# Patient Record
Sex: Male | Born: 1955 | Race: Black or African American | Hispanic: No | Marital: Single | Smoking: Former smoker
Health system: Southern US, Community
[De-identification: ages and names within clinical notes are randomized; demographics above are authoritative.]

## PROBLEM LIST (undated history)

## (undated) DIAGNOSIS — C61 Malignant neoplasm of prostate: Secondary | ICD-10-CM

## (undated) DIAGNOSIS — G4730 Sleep apnea, unspecified: Secondary | ICD-10-CM

## (undated) DIAGNOSIS — K219 Gastro-esophageal reflux disease without esophagitis: Secondary | ICD-10-CM

## (undated) DIAGNOSIS — E669 Obesity, unspecified: Secondary | ICD-10-CM

## (undated) DIAGNOSIS — I10 Essential (primary) hypertension: Secondary | ICD-10-CM

## (undated) DIAGNOSIS — E785 Hyperlipidemia, unspecified: Secondary | ICD-10-CM

## (undated) HISTORY — DX: Obesity, unspecified: E66.9

## (undated) HISTORY — PX: PROSTATE BIOPSY: SHX241

## (undated) HISTORY — DX: Essential (primary) hypertension: I10

---

## 2010-12-14 ENCOUNTER — Ambulatory Visit (HOSPITAL_COMMUNITY)
Admission: RE | Admit: 2010-12-14 | Discharge: 2010-12-14 | Disposition: A | Discharge status: Home / Self Care | Payer: Self-pay | Source: Ambulatory Visit | Attending: Family Medicine | Admitting: Family Medicine

## 2010-12-14 DIAGNOSIS — I10 Essential (primary) hypertension: Secondary | ICD-10-CM | POA: Insufficient documentation

## 2010-12-14 DIAGNOSIS — I517 Cardiomegaly: Secondary | ICD-10-CM

## 2010-12-14 DIAGNOSIS — R011 Cardiac murmur, unspecified: Secondary | ICD-10-CM | POA: Insufficient documentation

## 2010-12-21 ENCOUNTER — Encounter: Payer: Self-pay | Admitting: Urgent Care

## 2010-12-21 ENCOUNTER — Ambulatory Visit (INDEPENDENT_AMBULATORY_CARE_PROVIDER_SITE_OTHER): Payer: Self-pay | Admitting: Urgent Care

## 2010-12-21 DIAGNOSIS — K219 Gastro-esophageal reflux disease without esophagitis: Secondary | ICD-10-CM | POA: Insufficient documentation

## 2010-12-21 DIAGNOSIS — K921 Melena: Secondary | ICD-10-CM

## 2010-12-21 NOTE — Assessment & Plan Note (Signed)
Praneel Haisley. is a 55 y.o. black male w/ 2 day hx of hematochezia after starting a new BP pill.  Differentials include ischemia, colorectal ca or polyp, diverticular bleeding, or benign anorectal source.  Least likely rapid transit upper GI source given some recent GERD symptoms.  I have discussed risks & benefits which include, but are not limited to, bleeding, infection, perforation & drug reaction.  The patient agrees with this plan & written consent will be obtained.  Procedure will need to be done with deep sedation (propofol) in the OR under the direction of anesthesia services for hx polysubstance abuse.

## 2010-12-21 NOTE — Progress Notes (Signed)
Referring Provider: Tylene Fantasia., PA Primary Care Physician:  Tylene Fantasia., PA, PA-C Primary Gastroenterologist:  Dr. Darrick Penna  Chief Complaint  Patient presents with  . Diarrhea    better now  . Rectal Bleeding    HPI:  Greg Gates. is a 55 y.o. male here as a referral from Dr. Ledell Peoples for hematochezia & diarrhea.  He notes large amts bright red blood in his stool, the toilet water & on toilet paper after 2 days of diarrhea last week.  This started the same day he started a new BP pill (Atenolol/Chlorthalidone) last week.  No clots.  Diarrhea resolved after stopping pill.  Denies abdominal pain, nausea or vomiting.  Denies dysphagia or odynophagia.  Gaining wt since laid off.  Notes heartburn & Indigestion couple times per month.  Takes TUMS rarely & seems to resolve 100%.  Denies any OTC meds. Past Medical History  Diagnosis Date  . HTN (hypertension)   . Heart murmur    No past surgical history on file.  Current Outpatient Prescriptions  Medication Sig Dispense Refill  . pravastatin (PRAVACHOL) 20 MG tablet Take 20 mg by mouth daily.        Marland Kitchen triamterene-hydrochlorothiazide (MAXZIDE-25) 37.5-25 MG per tablet Take 1 tablet by mouth daily.         Allergies as of 12/21/2010  . (No Known Allergies)   Family History: There is no known family history of colorectal carcinoma , liver disease, or inflammatory bowel disease.  Problem Relation Age of Onset  . Diabetes Father     mother, sister  . Peripheral vascular disease Father   . Prostate cancer Father     uncle & cousin    History   Social History  . Marital Status: Single    Spouse Name: N/A    Number of Children: 1  . Years of Education: N/A   Occupational History  . unemployed     previously worked on [employer]   Social History Main Topics  . Smoking status: Former Smoker -- 1.0 packs/day for 25 years    Types: Cigarettes    Quit date: 12/20/2000  . Smokeless tobacco: Not on file  . Alcohol Use: Yes   daily etoh until 2002, now rarely  . Drug Use: Yes     quit 2002- acid, marijuana, cocaine, crack, hash   . Sexually Active: Not on file   Other Topics Concern  . Not on file   Social History Narrative   1 Grown biological son & step-daughterLives alone  Review of Systems: Gen: Denies any fever, chills, sweats, anorexia, fatigue, weakness, malaise, weight loss, and sleep disorder CV: Denies chest pain, angina, palpitations, syncope, orthopnea, PND, peripheral edema, and claudication. Resp: Denies dyspnea at rest, dyspnea with exercise, cough, sputum, wheezing, coughing up blood, and pleurisy. GI: Denies vomiting blood, jaundice, and fecal incontinence.   Denies dysphagia or odynophagia. GU : Denies urinary burning, blood in urine, urinary frequency, urinary hesitancy, nocturnal urination, and urinary incontinence. MS: Denies joint pain, limitation of movement, and swelling, stiffness, low back pain, extremity pain. Denies muscle weakness, cramps, atrophy.  Derm: Denies rash, itching, dry skin, hives, moles, warts, or unhealing ulcers.  Psych: Denies depression, anxiety, memory loss, suicidal ideation, hallucinations, paranoia, and confusion. Heme: Denies bruising, bleeding, and enlarged lymph nodes.  Physical Exam: BP 142/92  Pulse 89  Temp(Src) 98.2 F (36.8 C) (Temporal)  Ht 6\' 3"  (1.905 m)  Wt 334 lb (151.501 kg)  BMI 41.75 kg/m2 General:  Alert,  Well-developed, obese, pleasant and cooperative in NAD Head:  Normocephalic and atraumatic. Eyes:  Sclera clear, no icterus.   Conjunctiva pink. Ears:  Normal auditory acuity. Nose:  No deformity, discharge,  or lesions. Mouth:  No deformity or lesions, dentition normal. Neck:  Supple; no masses or thyromegaly. Lungs:  Clear throughout to auscultation.   No wheezes, crackles, or rhonchi. No acute distress. Heart:  Regular rate and rhythm; no murmurs, clicks, rubs,  or gallops. Abdomen:  Soft, obese, nontender and nondistended. No  masses, hepatosplenomegaly or hernias noted. Normal bowel sounds, without guarding, and without rebound.   Rectal:  Deferred until time of colonoscopy.   Msk:  Symmetrical without gross deformities. Normal posture. Pulses:  Normal pulses noted. Extremities:  Without clubbing or edema. Neurologic:  Alert and  oriented x4;  grossly normal neurologically. Skin:  Intact without significant lesions or rashes. Cervical Nodes:  No significant cervical adenopathy. Psych:  Alert and cooperative. Normal mood and affect.

## 2010-12-21 NOTE — Progress Notes (Signed)
Cc to PCP 

## 2010-12-21 NOTE — Assessment & Plan Note (Addendum)
Recent heartburn & indigestion sporadically that responds to TUMS.  Given hematochezia, would consider EGD if no lower GI source of bleeding is identified.  Placed on PPI for GERD symptoms and gastric protection.  Prilosec 20mg  daily (2 wks samples given)  I have discussed risks & benefits for EGD which include, but are not limited to, bleeding, infection, perforation & drug reaction.  The patient agrees with this plan & written consent will be obtained.    TO ER if severe bleeding or pain.

## 2010-12-21 NOTE — Patient Instructions (Addendum)
To ER if severe bleeding or pain Begin Prilosec 20 mg daily to protect stomach & for acid reflux (2 wks samples given)  Rectal Bleeding You have been bleeding from the rectal area. This condition can be caused by many different problems, including:  Hemorrhoids ("piles") and anal fissures (cracks in the anal canal).   Diverticulosis, polyps, and peptic ulcer disease.   Malignant tumors (cancer), injuries, and bowel inflammation.  Treatment depends on finding the cause of the bleeding. A sigmoid or colonoscopy exam and barium enema x-rays are often done to make the diagnosis. Avoid strenuous activity and drink plenty of fluids over the next week. Eat a high-fiber diet to keep your stool soft. Hot baths may be useful to soothe rectal pain. See your caregiver for follow-up evaluation as suggested by your caregiver. SEEK IMMEDIATE MEDICAL CARE IF YOU DEVELOP:  Increased bleeding, fever, severe pain, fainting, or other serious symptoms.  Document Released: 06/08/2004 Document Re-Released: 07/26/2009 [facility] Patient Information 2011 [place], [place].

## 2010-12-22 NOTE — Progress Notes (Signed)
In or 8/27

## 2011-01-03 ENCOUNTER — Encounter (HOSPITAL_COMMUNITY)
Admission: RE | Admit: 2011-01-03 | Discharge: 2011-01-03 | Disposition: A | Discharge status: Home / Self Care | Payer: Self-pay | Source: Ambulatory Visit | Attending: Gastroenterology | Admitting: Gastroenterology

## 2011-01-03 ENCOUNTER — Encounter (HOSPITAL_COMMUNITY): Payer: Self-pay

## 2011-01-03 ENCOUNTER — Other Ambulatory Visit: Payer: Self-pay

## 2011-01-03 HISTORY — DX: Gastro-esophageal reflux disease without esophagitis: K21.9

## 2011-01-03 HISTORY — DX: Sleep apnea, unspecified: G47.30

## 2011-01-03 HISTORY — DX: Hyperlipidemia, unspecified: E78.5

## 2011-01-03 LAB — CBC
HCT: 44.7 % (ref 39.0–52.0)
Hemoglobin: 14.8 g/dL (ref 13.0–17.0)
MCH: 26.3 pg (ref 26.0–34.0)
MCHC: 33.1 g/dL (ref 30.0–36.0)
MCV: 79.4 fL (ref 78.0–100.0)
RBC: 5.63 MIL/uL (ref 4.22–5.81)

## 2011-01-03 LAB — BASIC METABOLIC PANEL
BUN: 22 mg/dL (ref 6–23)
CO2: 27 mEq/L (ref 19–32)
Calcium: 10 mg/dL (ref 8.4–10.5)
GFR calc non Af Amer: 49 mL/min — ABNORMAL LOW (ref >60)
Glucose, Bld: 129 mg/dL — ABNORMAL HIGH (ref 70–99)
Sodium: 137 mEq/L (ref 135–145)

## 2011-01-03 NOTE — Patient Instructions (Signed)
20 Greg Gates.  01/03/2011   Your procedure is scheduled on:  01/09/11  Report to [facility] at 0930 AM.  Call this number if you have problems the morning of surgery: [phone]   Remember:   Do not eat food:After Midnight.  Do not drink clear liquids: After Midnight.  Take these medicines the morning of surgery with A SIP OF WATER: maxzide & prilosec   Do not wear jewelry, make-up or nail polish.  Do not wear lotions, powders, or perfumes. You may wear deodorant.  Do not shave 48 hours prior to surgery.  Do not bring valuables to the hospital.  Contacts, dentures or bridgework may not be worn into surgery.  Leave suitcase in the car. After surgery it may be brought to your room.  For patients admitted to the hospital, checkout time is 11:00 AM the day of discharge.   Patients discharged the day of surgery will not be allowed to drive home.  Name and phone number of your driver: family  Special Instructions: N/A   Please read over the following fact sheets that you were given: Pain Booklet, Anesthesia Post-op Instructions and Care and Recovery After Surgery   PATIENT INSTRUCTIONS POST-ANESTHESIA  IMMEDIATELY FOLLOWING SURGERY:  Do not drive or operate machinery for the first twenty four hours after surgery.  Do not make any important decisions for twenty four hours after surgery or while taking narcotic pain medications or sedatives.  If you develop intractable nausea and vomiting or a severe headache please notify your doctor immediately.  FOLLOW-UP:  Please make an appointment with your surgeon as instructed. You do not need to follow up with anesthesia unless specifically instructed to do so.  WOUND CARE INSTRUCTIONS (if applicable):  Keep a dry clean dressing on the anesthesia/puncture wound site if there is drainage.  Once the wound has quit draining you may leave it open to air.  Generally you should leave the bandage intact for twenty four hours unless there is drainage.   If the epidural site drains for more than 36-48 hours please call the anesthesia department.  QUESTIONS?:  Please feel free to call your physician or the hospital operator if you have any questions, and they will be happy to assist you.     [facility] Anesthesia Department [address] [place] [phone]

## 2011-01-09 ENCOUNTER — Other Ambulatory Visit: Payer: Self-pay | Admitting: Gastroenterology

## 2011-01-09 ENCOUNTER — Ambulatory Visit (HOSPITAL_COMMUNITY): Payer: Self-pay | Admitting: Anesthesiology

## 2011-01-09 ENCOUNTER — Encounter (HOSPITAL_COMMUNITY): Payer: Self-pay | Admitting: Anesthesiology

## 2011-01-09 ENCOUNTER — Encounter (HOSPITAL_COMMUNITY)
Admission: RE | Disposition: A | Discharge status: Home / Self Care | Payer: Self-pay | Source: Ambulatory Visit | Attending: Gastroenterology

## 2011-01-09 ENCOUNTER — Ambulatory Visit (HOSPITAL_COMMUNITY)
Admission: RE | Admit: 2011-01-09 | Discharge: 2011-01-09 | Disposition: A | Discharge status: Home / Self Care | Payer: Self-pay | Source: Ambulatory Visit | Attending: Gastroenterology | Admitting: Gastroenterology

## 2011-01-09 DIAGNOSIS — K219 Gastro-esophageal reflux disease without esophagitis: Secondary | ICD-10-CM

## 2011-01-09 DIAGNOSIS — K921 Melena: Secondary | ICD-10-CM

## 2011-01-09 DIAGNOSIS — I10 Essential (primary) hypertension: Secondary | ICD-10-CM | POA: Insufficient documentation

## 2011-01-09 DIAGNOSIS — D128 Benign neoplasm of rectum: Secondary | ICD-10-CM | POA: Insufficient documentation

## 2011-01-09 DIAGNOSIS — R197 Diarrhea, unspecified: Secondary | ICD-10-CM | POA: Insufficient documentation

## 2011-01-09 DIAGNOSIS — D129 Benign neoplasm of anus and anal canal: Secondary | ICD-10-CM | POA: Insufficient documentation

## 2011-01-09 DIAGNOSIS — D126 Benign neoplasm of colon, unspecified: Secondary | ICD-10-CM

## 2011-01-09 DIAGNOSIS — Z01812 Encounter for preprocedural laboratory examination: Secondary | ICD-10-CM | POA: Insufficient documentation

## 2011-01-09 DIAGNOSIS — K648 Other hemorrhoids: Secondary | ICD-10-CM

## 2011-01-09 DIAGNOSIS — Z79899 Other long term (current) drug therapy: Secondary | ICD-10-CM | POA: Insufficient documentation

## 2011-01-09 DIAGNOSIS — Z01810 Encounter for preprocedural cardiovascular examination: Secondary | ICD-10-CM | POA: Insufficient documentation

## 2011-01-09 HISTORY — PX: COLONOSCOPY: SHX5424

## 2011-01-09 SURGERY — COLONOSCOPY
Anesthesia: Monitor Anesthesia Care

## 2011-01-09 MED ORDER — LACTATED RINGERS IV SOLN: INTRAVENOUS | Status: DC

## 2011-01-09 MED ORDER — GLYCOPYRROLATE 0.2 MG/ML IJ SOLN
0.2000 mg | Freq: Once | INTRAMUSCULAR | Status: AC
  Administered 2011-01-09: 0.2 mg via INTRAVENOUS

## 2011-01-09 MED ORDER — MIDAZOLAM HCL 2 MG/2ML IJ SOLN
INTRAMUSCULAR | Status: AC
  Filled 2011-01-09: qty 2

## 2011-01-09 MED ORDER — MIDAZOLAM HCL 2 MG/2ML IJ SOLN
INTRAMUSCULAR | Status: AC
  Administered 2011-01-09: 2 mg via INTRAVENOUS
  Filled 2011-01-09: qty 2

## 2011-01-09 MED ORDER — PROPOFOL 10 MG/ML IV EMUL
INTRAVENOUS | Status: AC
  Filled 2011-01-09: qty 20

## 2011-01-09 MED ORDER — ONDANSETRON HCL 4 MG/2ML IJ SOLN
INTRAMUSCULAR | Status: AC
  Administered 2011-01-09: 4 mg via INTRAVENOUS
  Filled 2011-01-09: qty 2

## 2011-01-09 MED ORDER — GLYCOPYRROLATE 0.2 MG/ML IJ SOLN
INTRAMUSCULAR | Status: AC
  Administered 2011-01-09: 0.2 mg via INTRAVENOUS
  Filled 2011-01-09: qty 1

## 2011-01-09 MED ORDER — FENTANYL CITRATE 0.05 MG/ML IJ SOLN
INTRAMUSCULAR | Status: DC | PRN
  Administered 2011-01-09 (×2): 50 ug via INTRAVENOUS

## 2011-01-09 MED ORDER — MIDAZOLAM HCL 5 MG/5ML IJ SOLN
INTRAMUSCULAR | Status: DC | PRN
  Administered 2011-01-09 (×2): 2 mg via INTRAVENOUS

## 2011-01-09 MED ORDER — STERILE WATER FOR IRRIGATION IR SOLN
Status: DC | PRN
  Administered 2011-01-09: 12:00:00

## 2011-01-09 MED ORDER — MIDAZOLAM HCL 2 MG/2ML IJ SOLN
1.0000 mg | INTRAMUSCULAR | Status: DC | PRN
  Administered 2011-01-09: 2 mg via INTRAVENOUS

## 2011-01-09 MED ORDER — FENTANYL CITRATE 0.05 MG/ML IJ SOLN
INTRAMUSCULAR | Status: AC
  Filled 2011-01-09: qty 2

## 2011-01-09 MED ORDER — LACTATED RINGERS IV SOLN
INTRAVENOUS | Status: DC
  Administered 2011-01-09: 11:00:00 via INTRAVENOUS

## 2011-01-09 MED ORDER — PROPOFOL 10 MG/ML IV EMUL
INTRAVENOUS | Status: DC | PRN
  Administered 2011-01-09: 50 ug/kg/min via INTRAVENOUS

## 2011-01-09 MED ORDER — BUTAMBEN-TETRACAINE-BENZOCAINE 2-2-14 % EX AERO
1.0000 | INHALATION_SPRAY | Freq: Once | CUTANEOUS | Status: AC
  Administered 2011-01-09: 2 via TOPICAL
  Filled 2011-01-09: qty 56

## 2011-01-09 MED ORDER — ONDANSETRON HCL 4 MG/2ML IJ SOLN
4.0000 mg | Freq: Once | INTRAMUSCULAR | Status: AC
  Administered 2011-01-09: 4 mg via INTRAVENOUS

## 2011-01-09 SURGICAL SUPPLY — 24 items
BLOCK BITE 60FR ADLT L/F BLUE (MISCELLANEOUS) ×2 IMPLANT
ELECT REM PT RETURN 9FT ADLT (ELECTROSURGICAL)
ELECTRODE REM PT RTRN 9FT ADLT (ELECTROSURGICAL) IMPLANT
FCP BXJMBJMB 240X2.8X (CUTTING FORCEPS)
FLOOR PAD 36X40 (MISCELLANEOUS) ×2
FORCEP RJ3 GP 1.8X160 W-NEEDLE (CUTTING FORCEPS) IMPLANT
FORCEPS BIOP RAD 4 LRG CAP 4 (CUTTING FORCEPS) ×2 IMPLANT
FORCEPS BIOP RJ4 240 W/NDL (CUTTING FORCEPS)
FORCEPS BXJMBJMB 240X2.8X (CUTTING FORCEPS) IMPLANT
INJECTOR/SNARE I SNARE (MISCELLANEOUS) IMPLANT
LUBRICANT JELLY 4.5OZ STERILE (MISCELLANEOUS) ×2 IMPLANT
NEEDLE SCLEROTHERAPY 25GX240 (NEEDLE) IMPLANT
PAD FLOOR 36X40 (MISCELLANEOUS) ×1 IMPLANT
PROBE APC STR FIRE (PROBE) IMPLANT
PROBE INJECTION GOLD (MISCELLANEOUS)
PROBE INJECTION GOLD 7FR (MISCELLANEOUS) IMPLANT
SNARE ROTATE MED OVAL 20MM (MISCELLANEOUS) IMPLANT
SNARE SHORT THROW 13M SML OVAL (MISCELLANEOUS) IMPLANT
SYR 50ML LL SCALE MARK (SYRINGE) ×2 IMPLANT
TRAP SPECIMEN MUCOUS 40CC (MISCELLANEOUS) IMPLANT
TUBING ENDO SMARTCAP (MISCELLANEOUS) ×2 IMPLANT
TUBING ENDO SMARTCAP PENTAX (MISCELLANEOUS) ×4 IMPLANT
TUBING IRRIGATION ENDOGATOR (MISCELLANEOUS) ×2 IMPLANT
WATER STERILE IRR 1000ML POUR (IV SOLUTION) IMPLANT

## 2011-01-09 NOTE — H&P (Signed)
Current Vitals       Recorded User        12/21/2010  1:28 PM  Greg Mutton, LPN           BP Pulse Temp (Src) Resp Ht Wt    142/92  89  98.2 F (36.8 C) (Temporal)  N/A  6\' 3"  (1.905 m)  334 lb (151.501 kg)       BMI SpO2 PF    41.75 kg/m2  N/A  N/A          Progress Notes     Greg Burton, NP  12/21/2010  2:23 PM  Signed Referring Provider: Tylene Gates., PA Primary Care Physician:  Greg Gates., PA, PA-C Primary Gastroenterologist:  Dr. Darrick Gates    Chief Complaint   Patient presents with   .  Diarrhea       better now   .  Rectal Bleeding      HPI:  Greg Gates. is a 55 y.o. male here as a referral from Dr. Ledell Gates for hematochezia & diarrhea.  He notes large amts bright red blood in his stool, the toilet water & on toilet paper after 2 days of diarrhea last week.  This started the same day he started a new BP pill (Atenolol/Chlorthalidone) last week.  No clots.  Diarrhea resolved after stopping pill.  Denies abdominal pain, nausea or vomiting.  Denies dysphagia or odynophagia.  Gaining wt since laid off.  Notes heartburn & Indigestion couple times per month.  Takes TUMS rarely & seems to resolve 100%.  Denies any OTC meds. Past Medical History   Diagnosis  Date   .  HTN (hypertension)     .  Heart murmur      No past surgical history on file.    Current Outpatient Prescriptions   Medication  Sig  Dispense  Refill   .  pravastatin (PRAVACHOL) 20 MG tablet  Take 20 mg by mouth daily.           Marland Kitchen  triamterene-hydrochlorothiazide (MAXZIDE-25) 37.5-25 MG per tablet  Take 1 tablet by mouth daily.            Allergies as of 12/21/2010   .  (No Known Allergies)    Family History: There is no known family history of colorectal carcinoma , liver disease, or inflammatory bowel disease.   Problem  Relation  Age of Onset   .  Diabetes  Father         mother, sister   .  Peripheral vascular disease  Father     .  Prostate cancer  Father         uncle &  cousin         History       Social History   .  Marital Status:  Single       Spouse Name:  N/A       Number of Children:  1   .  Years of Education:  N/A       Occupational History   .  unemployed         previously worked on [employer]       Social History Main Topics   .  Smoking status:  Former Smoker -- 1.0 packs/day for 25 years       Types:  Cigarettes       Quit date:  12/20/2000   .  Smokeless tobacco:  Not on file   .  Alcohol Use:  Yes         daily etoh until 2002, now rarely   .  Drug Use:  Yes         quit 2002- acid, marijuana, cocaine, crack, hash    .  Sexually Active:  Not on file       Other Topics  Concern   .  Not on file       Social History Narrative     1 Grown biological son & step-daughterLives alone    Review of Systems: Gen: Denies any fever, chills, sweats, anorexia, fatigue, weakness, malaise, weight loss, and sleep disorder CV: Denies chest pain, angina, palpitations, syncope, orthopnea, PND, peripheral edema, and claudication. Resp: Denies dyspnea at rest, dyspnea with exercise, cough, sputum, wheezing, coughing up blood, and pleurisy. GI: Denies vomiting blood, jaundice, and fecal incontinence.   Denies dysphagia or odynophagia. GU : Denies urinary burning, blood in urine, urinary frequency, urinary hesitancy, nocturnal urination, and urinary incontinence. MS: Denies joint pain, limitation of movement, and swelling, stiffness, low back pain, extremity pain. Denies muscle weakness, cramps, atrophy.   Derm: Denies rash, itching, dry skin, hives, moles, warts, or unhealing ulcers.   Psych: Denies depression, anxiety, memory loss, suicidal ideation, hallucinations, paranoia, and confusion. Heme: Denies bruising, bleeding, and enlarged lymph nodes.   Physical Exam: BP 142/92  Pulse 89  Temp(Src) 98.2 F (36.8 C) (Temporal)  Ht 6\' 3"  (1.905 m)  Wt 334 lb (151.501 kg)  BMI 41.75 kg/m2 General:   Alert,  Well-developed, obese, pleasant  and cooperative in NAD Head:  Normocephalic and atraumatic. Eyes:  Sclera clear, no icterus.   Conjunctiva pink. Ears:  Normal auditory acuity. Nose:  No deformity, discharge,  or lesions. Mouth:  No deformity or lesions, dentition normal. Neck:  Supple; no masses or thyromegaly. Lungs:  Clear throughout to auscultation.   No wheezes, crackles, or rhonchi. No acute distress. Heart:  Regular rate and rhythm; no murmurs, clicks, rubs,  or gallops. Abdomen:  Soft, obese, nontender and nondistended. No masses, hepatosplenomegaly or hernias noted. Normal bowel sounds, without guarding, and without rebound.    Rectal:  Deferred until time of colonoscopy.    Msk:  Symmetrical without gross deformities. Normal posture. Pulses:  Normal pulses noted. Extremities:  Without clubbing or edema. Neurologic:  Alert and  oriented x4;  grossly normal neurologically. Skin:  Intact without significant lesions or rashes. Cervical Nodes:  No significant cervical adenopathy. Psych:  Alert and cooperative. Normal mood and affect.     [name]  12/21/2010  2:48 PM  Signed Cc to PCP  Jonette Eva, MD  12/22/2010  3:12 PM  Signed In or 8/27        Hematochezia - Greg Burton, NP  12/21/2010  2:20 PM  Signed [name] is a 55 y.o. black male w/ 2 day hx of hematochezia after starting a new BP pill.  Differentials include ischemia, colorectal ca or polyp, diverticular bleeding, or benign anorectal source.  Least likely rapid transit upper GI source given some recent GERD symptoms.   I have discussed risks & benefits which include, but are not limited to, bleeding, infection, perforation & drug reaction.  The patient agrees with this plan & written consent will be obtained.  Procedure will need to be done with deep sedation (propofol) in the OR under the direction of anesthesia services for hx polysubstance abuse.       GERD (gastroesophageal reflux disease) Yetta Barre,  KANDICE, NP  12/21/2010  2:23 PM   Addendum Recent heartburn & indigestion sporadically that responds to TUMS.  Given hematochezia, would consider EGD if no lower GI source of bleeding is identified.  Placed on PPI for GERD symptoms and gastric protection.   Prilosec 20mg  daily (2 wks samples given)   I have discussed risks & benefits for EGD which include, but are not limited to, bleeding, infection, perforation & drug reaction.  The patient agrees with this plan & written consent will be obtained.     TO ER if severe bleeding or pain.

## 2011-01-09 NOTE — Transfer of Care (Signed)
  Anesthesia Post-op Note  Patient: Greg Gates.  Procedure(s) Performed:  COLONOSCOPY - with propofol,1131 in cecum, total withdrawel time minutes  Patient Location: PACU  Anesthesia Type: MAC  Level of Consciousness: awake and alert   Airway and Oxygen Therapy: Patient Spontanous Breathing and Patient connected to nasal cannula oxygen  Post-op Pain: none  Post-op Assessment: Post-op Vital signs reviewed and Patient's Cardiovascular Status Stable  Post-op Vital Signs: Reviewed and stable  Complications: No apparent anesthesia complications

## 2011-01-09 NOTE — Anesthesia Preprocedure Evaluation (Addendum)
Anesthesia Evaluation  Name, MR# and DOB Patient awake  General Assessment Comment  Reviewed: Allergy & Precautions, H&P , NPO status , Patient's Chart, lab work & pertinent test results  Airway Mallampati: II  Neck ROM: Full    Dental  (+) Missing   Pulmonary  sleep apnea    pulmonary exam normalPulmonary Exam Normal     Cardiovascular hypertension, Pt. on medications Regular Normal    Neuro/Psych   GI/Hepatic/Renal        GERD Medicated     Endo/Other    Abdominal (+) obese,   Musculoskeletal   Hematology   Peds  Reproductive/Obstetrics    Anesthesia Other Findings             Anesthesia Physical Anesthesia Plan  ASA: III  Anesthesia Plan: MAC   Post-op Pain Management:    Induction: Intravenous  Airway Management Planned: Simple Face Mask  Additional Equipment:   Intra-op Plan:   Post-operative Plan:   Informed Consent: I have reviewed the patients History and Physical, chart, labs and discussed the procedure including the risks, benefits and alternatives for the proposed anesthesia with the patient or authorized representative who has indicated his/her understanding and acceptance.     Plan Discussed with:   Anesthesia Plan Comments:         Anesthesia Quick Evaluation

## 2011-01-09 NOTE — Anesthesia Postprocedure Evaluation (Signed)
Anesthesia Post Note  Patient: Greg Gates.  Procedure(s) Performed:  COLONOSCOPY - with propofol,1131 in cecum, total withdrawel time 16 minutes  Anesthesia type: MAC  Patient location: PACU  Post pain: Pain level controlled  Post assessment: Post-op Vital signs reviewed, Patient's Cardiovascular Status Stable and Respiratory Function Stable  Last Vitals:  Filed Vitals:   01/09/11 1155  BP: 88/45  Temp: 97.9 F (36.6 C)  Resp:     Post vital signs: Reviewed and stable  Level of consciousness: alert  and sedated  Complications: No apparent anesthesia complications

## 2011-01-09 NOTE — Preoperative (Signed)
No B Blocker NoAntibioy=tct

## 2011-01-09 NOTE — Progress Notes (Signed)
Patient tolerated prep well.  Movie prep with results of clear liquid stool.

## 2011-01-09 NOTE — Interval H&P Note (Signed)
History and Physical Interval Note:   01/09/2011   10:40 AM   [name]  has presented today for surgery, with the diagnosis of POLYSUBSTANCE ABUSE HEMATOCHEZIA,GERD/CM  The various methods of treatment have been discussed with the patient and family. After consideration of risks, benefits and other options for treatment, the patient has consented to  Procedure(s): COLONOSCOPY ESOPHAGOGASTRODUODENOSCOPY (EGD) as a surgical intervention .  I have reviewed the patients' chart and labs.  Questions were answered to the patient's satisfaction.     Jonette Eva  MD

## 2011-01-10 ENCOUNTER — Telehealth: Payer: Self-pay | Admitting: Gastroenterology

## 2011-01-10 NOTE — Telephone Encounter (Signed)
Pt is scheduled for repeat TCS on 02/13/11-

## 2011-01-10 NOTE — Telephone Encounter (Signed)
Please call pt. 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 TCS in OCT 2012. High fiber diet.

## 2011-01-11 NOTE — Telephone Encounter (Signed)
Pt aware of info and new appt.

## 2011-01-11 NOTE — Telephone Encounter (Signed)
Results Cc to PCP  

## 2011-01-13 ENCOUNTER — Encounter (HOSPITAL_COMMUNITY): Payer: Self-pay | Admitting: Gastroenterology

## 2011-02-06 ENCOUNTER — Telehealth: Payer: Self-pay

## 2011-02-06 ENCOUNTER — Encounter (HOSPITAL_COMMUNITY): Payer: Self-pay

## 2011-02-06 ENCOUNTER — Encounter (HOSPITAL_COMMUNITY)
Admission: RE | Admit: 2011-02-06 | Discharge: 2011-02-06 | Disposition: A | Discharge status: Home / Self Care | Payer: Self-pay | Source: Ambulatory Visit | Attending: Gastroenterology | Admitting: Gastroenterology

## 2011-02-06 NOTE — Patient Instructions (Addendum)
20 Greg Gates.  02/06/2011   Your procedure is scheduled on:  02/13/2011  Report to [facility] at 615  AM.  Call this number if you have problems the morning of surgery: [phone]   Remember:   Do not eat food:After Midnight.  Do not drink clear liquids: After Midnight.  Take these medicines the morning of surgery with A SIP OF WATER: prilosec,maxzide   Do not wear jewelry, make-up or nail polish.  Do not wear lotions, powders, or perfumes. You may wear deodorant.  Do not shave 48 hours prior to surgery.  Do not bring valuables to the hospital.  Contacts, dentures or bridgework may not be worn into surgery.  Leave suitcase in the car. After surgery it may be brought to your room.  For patients admitted to the hospital, checkout time is 11:00 AM the day of discharge.   Patients discharged the day of surgery will not be allowed to drive home.  Name and phone number of your driver: family  Special Instructions: N/A   Please read over the following fact sheets that you were given: Pain Booklet, Surgical Site Infection Prevention, Anesthesia Post-op Instructions and Care and Recovery After Surgery PATIENT INSTRUCTIONS POST-ANESTHESIA  IMMEDIATELY FOLLOWING SURGERY:  Do not drive or operate machinery for the first twenty four hours after surgery.  Do not make any important decisions for twenty four hours after surgery or while taking narcotic pain medications or sedatives.  If you develop intractable nausea and vomiting or a severe headache please notify your doctor immediately.  FOLLOW-UP:  Please make an appointment with your surgeon as instructed. You do not need to follow up with anesthesia unless specifically instructed to do so.  WOUND CARE INSTRUCTIONS (if applicable):  Keep a dry clean dressing on the anesthesia/puncture wound site if there is drainage.  Once the wound has quit draining you may leave it open to air.  Generally you should leave the bandage intact for twenty four  hours unless there is drainage.  If the epidural site drains for more than 36-48 hours please call the anesthesia department.  QUESTIONS?:  Please feel free to call your physician or the hospital operator if you have any questions, and they will be happy to assist you.     [facility] Anesthesia Department [address] [place] [phone]

## 2011-02-06 NOTE — Telephone Encounter (Signed)
Pt came by office and requested Prilosec samples. Gave # [phone].

## 2011-02-06 NOTE — Telephone Encounter (Signed)
Agree Keep colonoscopy and EGD as planned.

## 2011-02-06 NOTE — Pre-Procedure Instructions (Signed)
Labs from 01/04/2011 shown to Dr Jayme Cloud. Ok to use these labs for 02/13/2011 surgery.

## 2011-02-07 NOTE — Telephone Encounter (Signed)
Pt aware.

## 2011-02-13 ENCOUNTER — Encounter (HOSPITAL_COMMUNITY): Payer: Self-pay | Admitting: Anesthesiology

## 2011-02-13 ENCOUNTER — Encounter (HOSPITAL_COMMUNITY)
Admission: RE | Disposition: A | Discharge status: Home / Self Care | Payer: Self-pay | Source: Ambulatory Visit | Attending: Gastroenterology

## 2011-02-13 ENCOUNTER — Ambulatory Visit (HOSPITAL_COMMUNITY)
Admission: RE | Admit: 2011-02-13 | Discharge: 2011-02-13 | Disposition: A | Discharge status: Home / Self Care | Payer: Self-pay | Source: Ambulatory Visit | Attending: Gastroenterology | Admitting: Gastroenterology

## 2011-02-13 ENCOUNTER — Ambulatory Visit (HOSPITAL_COMMUNITY): Payer: Self-pay | Admitting: Anesthesiology

## 2011-02-13 ENCOUNTER — Encounter (HOSPITAL_COMMUNITY): Payer: Self-pay

## 2011-02-13 ENCOUNTER — Other Ambulatory Visit: Payer: Self-pay | Admitting: Gastroenterology

## 2011-02-13 DIAGNOSIS — E7800 Pure hypercholesterolemia, unspecified: Secondary | ICD-10-CM | POA: Insufficient documentation

## 2011-02-13 DIAGNOSIS — K921 Melena: Secondary | ICD-10-CM | POA: Insufficient documentation

## 2011-02-13 DIAGNOSIS — Z79899 Other long term (current) drug therapy: Secondary | ICD-10-CM | POA: Insufficient documentation

## 2011-02-13 DIAGNOSIS — D128 Benign neoplasm of rectum: Secondary | ICD-10-CM | POA: Insufficient documentation

## 2011-02-13 DIAGNOSIS — K648 Other hemorrhoids: Secondary | ICD-10-CM

## 2011-02-13 DIAGNOSIS — K5730 Diverticulosis of large intestine without perforation or abscess without bleeding: Secondary | ICD-10-CM

## 2011-02-13 DIAGNOSIS — K621 Rectal polyp: Secondary | ICD-10-CM

## 2011-02-13 DIAGNOSIS — I10 Essential (primary) hypertension: Secondary | ICD-10-CM | POA: Insufficient documentation

## 2011-02-13 DIAGNOSIS — K620 Anal polyp: Secondary | ICD-10-CM

## 2011-02-13 DIAGNOSIS — K625 Hemorrhage of anus and rectum: Secondary | ICD-10-CM

## 2011-02-13 HISTORY — PX: POLYPECTOMY: SHX5525

## 2011-02-13 SURGERY — COLONOSCOPY WITH PROPOFOL
Anesthesia: Monitor Anesthesia Care | Site: Rectum

## 2011-02-13 MED ORDER — ONDANSETRON HCL 4 MG/2ML IJ SOLN
INTRAMUSCULAR | Status: AC
  Administered 2011-02-13: 4 mg via INTRAVENOUS
  Filled 2011-02-13: qty 2

## 2011-02-13 MED ORDER — MIDAZOLAM HCL 2 MG/2ML IJ SOLN
INTRAMUSCULAR | Status: AC
  Administered 2011-02-13: 2 mg via INTRAVENOUS
  Filled 2011-02-13: qty 2

## 2011-02-13 MED ORDER — PROPOFOL 10 MG/ML IV EMUL
INTRAVENOUS | Status: AC
  Filled 2011-02-13: qty 20

## 2011-02-13 MED ORDER — ONDANSETRON HCL 4 MG/2ML IJ SOLN: 4.0000 mg | Freq: Once | INTRAMUSCULAR | Status: DC | PRN

## 2011-02-13 MED ORDER — MIDAZOLAM HCL 2 MG/2ML IJ SOLN
INTRAMUSCULAR | Status: AC
  Filled 2011-02-13: qty 2

## 2011-02-13 MED ORDER — FENTANYL CITRATE 0.05 MG/ML IJ SOLN: 25.0000 ug | INTRAMUSCULAR | Status: DC | PRN

## 2011-02-13 MED ORDER — GLYCOPYRROLATE 0.2 MG/ML IJ SOLN
0.2000 mg | Freq: Once | INTRAMUSCULAR | Status: AC
  Administered 2011-02-13: 0.2 mg via INTRAVENOUS

## 2011-02-13 MED ORDER — MIDAZOLAM HCL 2 MG/2ML IJ SOLN
1.0000 mg | INTRAMUSCULAR | Status: DC | PRN
  Administered 2011-02-13: 2 mg via INTRAVENOUS

## 2011-02-13 MED ORDER — STERILE WATER FOR IRRIGATION IR SOLN
Status: DC | PRN
  Administered 2011-02-13: 07:00:00

## 2011-02-13 MED ORDER — ONDANSETRON HCL 4 MG/2ML IJ SOLN
4.0000 mg | Freq: Once | INTRAMUSCULAR | Status: AC
  Administered 2011-02-13: 4 mg via INTRAVENOUS

## 2011-02-13 MED ORDER — GLYCOPYRROLATE 0.2 MG/ML IJ SOLN
INTRAMUSCULAR | Status: AC
  Administered 2011-02-13: 0.2 mg via INTRAVENOUS
  Filled 2011-02-13: qty 1

## 2011-02-13 MED ORDER — MIDAZOLAM HCL 5 MG/5ML IJ SOLN
INTRAMUSCULAR | Status: DC | PRN
  Administered 2011-02-13: 2 mg via INTRAVENOUS

## 2011-02-13 MED ORDER — FENTANYL CITRATE 0.05 MG/ML IJ SOLN
INTRAMUSCULAR | Status: AC
  Filled 2011-02-13: qty 2

## 2011-02-13 MED ORDER — PROPOFOL 10 MG/ML IV EMUL
INTRAVENOUS | Status: DC | PRN
  Administered 2011-02-13: 50 ug/kg/min via INTRAVENOUS

## 2011-02-13 MED ORDER — LIDOCAINE HCL (PF) 1 % IJ SOLN
INTRAMUSCULAR | Status: AC
  Filled 2011-02-13: qty 5

## 2011-02-13 MED ORDER — FENTANYL CITRATE 0.05 MG/ML IJ SOLN
INTRAMUSCULAR | Status: DC | PRN
  Administered 2011-02-13 (×2): 50 ug via INTRAVENOUS

## 2011-02-13 MED ORDER — WATER FOR IRRIGATION, STERILE IR SOLN
Status: DC | PRN
  Administered 2011-02-13: 1000 mL

## 2011-02-13 MED ORDER — LACTATED RINGERS IV SOLN
INTRAVENOUS | Status: DC
  Administered 2011-02-13: 07:00:00 via INTRAVENOUS

## 2011-02-13 SURGICAL SUPPLY — 25 items
BLOCK BITE 60FR ADLT L/F BLUE (MISCELLANEOUS) ×4 IMPLANT
ELECT REM PT RETURN 9FT ADLT (ELECTROSURGICAL)
ELECTRODE REM PT RTRN 9FT ADLT (ELECTROSURGICAL) IMPLANT
FCP BXJMBJMB 240X2.8X (CUTTING FORCEPS)
FLOOR PAD 36X40 (MISCELLANEOUS) ×4
FORCEP RJ3 GP 1.8X160 W-NEEDLE (CUTTING FORCEPS) IMPLANT
FORCEPS BIOP RAD 4 LRG CAP 4 (CUTTING FORCEPS) ×4 IMPLANT
FORCEPS BIOP RJ4 240 W/NDL (CUTTING FORCEPS)
FORCEPS BXJMBJMB 240X2.8X (CUTTING FORCEPS) IMPLANT
INJECTOR/SNARE I SNARE (MISCELLANEOUS) IMPLANT
LUBRICANT JELLY 4.5OZ STERILE (MISCELLANEOUS) ×4 IMPLANT
MANIFOLD NEPTUNE II (INSTRUMENTS) ×4 IMPLANT
NEEDLE SCLEROTHERAPY 25GX240 (NEEDLE) IMPLANT
PAD FLOOR 36X40 (MISCELLANEOUS) ×3 IMPLANT
PROBE APC STR FIRE (PROBE) IMPLANT
PROBE INJECTION GOLD (MISCELLANEOUS)
PROBE INJECTION GOLD 7FR (MISCELLANEOUS) IMPLANT
SNARE ROTATE MED OVAL 20MM (MISCELLANEOUS) IMPLANT
SNARE SHORT THROW 13M SML OVAL (MISCELLANEOUS) IMPLANT
SYR 50ML LL SCALE MARK (SYRINGE) ×4 IMPLANT
TRAP SPECIMEN MUCOUS 40CC (MISCELLANEOUS) IMPLANT
TUBING ENDO SMARTCAP (MISCELLANEOUS) ×4 IMPLANT
TUBING ENDO SMARTCAP PENTAX (MISCELLANEOUS) ×8 IMPLANT
TUBING IRRIGATION ENDOGATOR (MISCELLANEOUS) ×4 IMPLANT
WATER STERILE IRR 1000ML POUR (IV SOLUTION) ×8 IMPLANT

## 2011-02-13 NOTE — Anesthesia Postprocedure Evaluation (Signed)
  Anesthesia Post-op Note  Patient: Greg Gates.  Procedure(s) Performed:  COLONOSCOPY WITH PROPOFOL - in cecum at 0830  ; total withdrawal time = ; POLYPECTOMY  Patient Location: PACU  Anesthesia Type: MAC  Level of Consciousness: awake and alert   Airway and Oxygen Therapy: Patient Spontanous Breathing and Patient connected to face mask oxygen  Post-op Pain: none  Post-op Assessment: Post-op Vital signs reviewed  Post-op Vital Signs: Reviewed  Complications: No apparent anesthesia complications

## 2011-02-13 NOTE — Anesthesia Preprocedure Evaluation (Addendum)
Anesthesia Evaluation  Name, MR# and DOB Patient awake  General Assessment Comment  Reviewed: Allergy & Precautions, H&P , NPO status , Patient's Chart, lab work & pertinent test results  Airway Mallampati: II TM Distance: >3 FB Neck ROM: Full    Dental  (+) Teeth Intact and Missing   Pulmonary sleep apnea    Pulmonary exam normal       Cardiovascular hypertension, Pt. on medications Regular Normal    Neuro/Psych    GI/Hepatic GERD Medicated(+)     substance abuse   ,   Endo/Other    Renal/GU      Musculoskeletal   Abdominal (+) obese,   Peds  Hematology   Anesthesia Other Findings   Reproductive/Obstetrics                           Anesthesia Physical Anesthesia Plan  ASA: III  Anesthesia Plan: MAC   Post-op Pain Management:    Induction: Intravenous  Airway Management Planned: Simple Face Mask  Additional Equipment:   Intra-op Plan:   Post-operative Plan:   Informed Consent: I have reviewed the patients History and Physical, chart, labs and discussed the procedure including the risks, benefits and alternatives for the proposed anesthesia with the patient or authorized representative who has indicated his/her understanding and acceptance.     Plan Discussed with:   Anesthesia Plan Comments:         Anesthesia Quick Evaluation

## 2011-02-13 NOTE — H&P (Addendum)
Reason for Visit     Diarrhea    better now    Rectal Bleeding        Vitals - Last Recorded       BP Pulse Temp(Src) Ht Wt BMI    142/92  89  98.2 F (36.8 C) (Temporal)  6\' 3"  (1.905 m)  334 lb (151.501 kg)  41.75 kg/m2       Vitals History Recorded          Progress Notes     Greg Burton, NP  12/21/2010  2:23 PM  Signed Referring Provider: Tylene Fantasia., PA Primary Care Physician:  Tylene Fantasia., PA, PA-C Primary Gastroenterologist:  Dr. Darrick Penna    Chief Complaint   Patient presents with   .  Diarrhea       better now   .  Rectal Bleeding      HPI:  Greg Gates. is a 55 y.o. male here as a referral from Dr. Ledell Peoples for hematochezia & diarrhea.  He notes large amts bright red blood in his stool, the toilet water & on toilet paper after 2 days of diarrhea last week.  This started the same day he started a new BP pill (Atenolol/Chlorthalidone) last week.  No clots.  Diarrhea resolved after stopping pill.  Denies abdominal pain, nausea or vomiting.  Denies dysphagia or odynophagia.  Gaining wt since laid off.  Notes heartburn & Indigestion couple times per month.  Takes TUMS rarely & seems to resolve 100%.  Denies any OTC meds. Past Medical History   Diagnosis  Date   .  HTN (hypertension)     .  Heart murmur      No past surgical history on file.    Current Outpatient Prescriptions   Medication  Sig  Dispense  Refill   .  pravastatin (PRAVACHOL) 20 MG tablet  Take 20 mg by mouth daily.           Marland Kitchen  triamterene-hydrochlorothiazide (MAXZIDE-25) 37.5-25 MG per tablet  Take 1 tablet by mouth daily.            Allergies as of 12/21/2010   .  (No Known Allergies)    Family History: There is no known family history of colorectal carcinoma , liver disease, or inflammatory bowel disease.   Problem  Relation  Age of Onset   .  Diabetes  Father         mother, sister   .  Peripheral vascular disease  Father     .  Prostate cancer  Father         uncle &  cousin         History       Social History   .  Marital Status:  Single       Spouse Name:  N/A       Number of Children:  1   .  Years of Education:  N/A       Occupational History   .  unemployed         previously worked on [employer]       Social History Main Topics   .  Smoking status:  Former Smoker -- 1.0 packs/day for 25 years       Types:  Cigarettes       Quit date:  12/20/2000   .  Smokeless tobacco:  Not on file   .  Alcohol Use:  Yes         daily etoh until 2002, now rarely   .  Drug Use:  Yes         quit 2002- acid, marijuana, cocaine, crack, hash    .  Sexually Active:  Not on file       Other Topics  Concern   .  Not on file       Social History Narrative     1 Grown biological son & step-daughterLives alone    Review of Systems: Gen: Denies any fever, chills, sweats, anorexia, fatigue, weakness, malaise, weight loss, and sleep disorder CV: Denies chest pain, angina, palpitations, syncope, orthopnea, PND, peripheral edema, and claudication. Resp: Denies dyspnea at rest, dyspnea with exercise, cough, sputum, wheezing, coughing up blood, and pleurisy. GI: Denies vomiting blood, jaundice, and fecal incontinence.   Denies dysphagia or odynophagia. GU : Denies urinary burning, blood in urine, urinary frequency, urinary hesitancy, nocturnal urination, and urinary incontinence. MS: Denies joint pain, limitation of movement, and swelling, stiffness, low back pain, extremity pain. Denies muscle weakness, cramps, atrophy.   Derm: Denies rash, itching, dry skin, hives, moles, warts, or unhealing ulcers.   Psych: Denies depression, anxiety, memory loss, suicidal ideation, hallucinations, paranoia, and confusion. Heme: Denies bruising, bleeding, and enlarged lymph nodes.   Physical Exam: BP 142/92  Pulse 89  Temp(Src) 98.2 F (36.8 C) (Temporal)  Ht 6\' 3"  (1.905 m)  Wt 334 lb (151.501 kg)  BMI 41.75 kg/m2 General:   Alert,  Well-developed, obese, pleasant  and cooperative in NAD Head:  Normocephalic and atraumatic. Eyes:  Sclera clear, no icterus.   Conjunctiva pink. Ears:  Normal auditory acuity. Nose:  No deformity, discharge,  or lesions. Mouth:  No deformity or lesions, dentition normal. Neck:  Supple; no masses or thyromegaly. Lungs:  Clear throughout to auscultation.   No wheezes, crackles, or rhonchi. No acute distress. Heart:  Regular rate and rhythm; no murmurs, clicks, rubs,  or gallops. Abdomen:  Soft, obese, nontender and nondistended. No masses, hepatosplenomegaly or hernias noted. Normal bowel sounds, without guarding, and without rebound.    Rectal:  Deferred until time of colonoscopy.    Msk:  Symmetrical without gross deformities. Normal posture. Pulses:  Normal pulses noted. Extremities:  Without clubbing or edema. Neurologic:  Alert and  oriented x4;  grossly normal neurologically. Skin:  Intact without significant lesions or rashes. Cervical Nodes:  No significant cervical adenopathy. Psych:  Alert and cooperative. Normal mood and affect.     [name]  12/21/2010  2:48 PM  Signed Cc to PCP  Greg Eva, MD  12/22/2010  3:12 PM  Signed In or 8/27        Hematochezia - Greg Burton, NP  12/21/2010  2:20 PM  Signed [name] is a 55 y.o. black male w/ 2 day hx of hematochezia after starting a new BP pill.  Differentials include ischemia, colorectal ca or polyp, diverticular bleeding, or benign anorectal source.  Least likely rapid transit upper GI source given some recent GERD symptoms.   I have discussed risks & benefits which include, but are not limited to, bleeding, infection, perforation & drug reaction.  The patient agrees with this plan & written consent will be obtained.  Procedure will need to be done with deep sedation (propofol) in the OR under the direction of anesthesia services for hx polysubstance abuse.       GERD (gastroesophageal reflux disease) - Greg Burton, NP  12/21/2010  2:23 PM   Addendum Recent heartburn & indigestion sporadically that responds to TUMS.  Given hematochezia, would consider EGD if no lower GI source of bleeding is identified.  Placed on PPI for GERD symptoms and gastric protection.   Prilosec 20mg  daily (2 wks samples given)   I have discussed risks & benefits for EGD which include, but are not limited to, bleeding, infection, perforation & drug reaction.  The patient agrees with this plan & written consent will be obtained.     TO ER if severe bleeding or pain.

## 2011-02-13 NOTE — Transfer of Care (Signed)
Immediate Anesthesia Transfer of Care Note  Patient: Greg Gates.  Procedure(s) Performed:  COLONOSCOPY WITH PROPOFOL - in cecum at 0830  ; total withdrawal time = ; POLYPECTOMY  Patient Location: PACU  Anesthesia Type: MAC  Level of Consciousness: awake and alert   Airway & Oxygen Therapy: Patient Spontanous Breathing and Patient connected to face mask oxygen  Post-op Assessment: Report given to PACU RN  Post vital signs: Reviewed and stable  Complications: No apparent anesthesia complications

## 2011-02-13 NOTE — Interval H&P Note (Signed)
History and Physical Interval Note:   02/13/2011   7:53 AM   Greg Hatch.  has presented today for surgery, with the diagnosis of hematochezia & GERD  The various methods of treatment have been discussed with the patient and family. After consideration of risks, benefits and other options for treatment, the patient has consented to  Procedure(s): COLONOSCOPY WITH PROPOFOL ESOPHAGOGASTRODUODENOSCOPY (EGD) WITH PROPOFOL as a surgical intervention .  I have reviewed the patients' chart and labs.  Questions were answered to the patient's satisfaction.     Jonette Eva  MD   Primary Care Physician:  Tylene Fantasia., PA, PA-C Primary Gastroenterologist:  Dr. Darrick Penna  Pre-Procedure History & Physical: HPI:  Greg Winer. is a 55 y.o. male here for   Past Medical History  Diagnosis Date  . HTN (hypertension)   . Heart murmur   . Hyperlipidemia   . GERD (gastroesophageal reflux disease)   . Sleep apnea     pt. states he has been told he has sleep apnea but has never been tested    Past Surgical History  Procedure Date  . Colonoscopy 01/09/2011    Procedure: COLONOSCOPY;  Surgeon: Arlyce Harman, MD;  Location: [facility];  Service: Endoscopy;  Laterality: N/A;  with propofol,1131 in cecum, total withdrawel time 16 minutes    Prior to Admission medications   Medication Sig Start Date End Date Taking? Authorizing Provider  omeprazole (PRILOSEC) 20 MG capsule Take 20 mg by mouth daily.     Yes Historical Provider, MD  pravastatin (PRAVACHOL) 20 MG tablet Take 20 mg by mouth daily.     Yes Historical Provider, MD  triamterene-hydrochlorothiazide (MAXZIDE-25) 37.5-25 MG per tablet Take 1 tablet by mouth daily.    Yes Historical Provider, MD    Allergies as of 01/09/2011  . (No Known Allergies)    Family History  Problem Relation Age of Onset  . Diabetes Father     mother, sister  . Peripheral vascular disease Father   . Prostate cancer Father     uncle & cousin  .  Anesthesia problems Neg Hx   . Hypotension Neg Hx   . Malignant hyperthermia Neg Hx   . Pseudochol deficiency Neg Hx     History   Social History  . Marital Status: Legally Separated    Spouse Name: N/A    Number of Children: 1  . Years of Education: N/A   Occupational History  . unemployed     previously worked on [employer] administrator   Social History Main Topics  . Smoking status: Former Smoker -- 1.0 packs/day for 25 years    Types: Cigarettes    Quit date: 12/20/2000  . Smokeless tobacco: Not on file  . Alcohol Use: Yes     daily etoh until 2002, now rarely  . Drug Use: Yes     quit 2002- acid, marijuana, cocaine, crack, hash   . Sexually Active: Yes    Birth Control/ Protection: None   Other Topics Concern  . Not on file   Social History Narrative   1 Grown biological son & step-daughterLives alone    Review of Systems: See HPI, otherwise negative ROS   Physical Exam: BP 134/67  Pulse 50  Temp(Src) 98.6 F (37 C) (Oral)  Resp 18  SpO2 96% General:   Alert,  pleasant and cooperative in NAD Head:  Normocephalic and atraumatic. Neck:  Supple; no masses or thyromegaly. Lungs:  Clear throughout to auscultation.  Heart:  Regular rate and rhythm. Abdomen:  Soft, nontender and nondistended. Normal bowel sounds, without guarding, and without rebound.   Neurologic:  Alert and  oriented x4;  grossly normal neurologically.  Impression/Plan:    TCS/EGD TODAY

## 2011-02-16 ENCOUNTER — Encounter (HOSPITAL_COMMUNITY): Payer: Self-pay | Admitting: Gastroenterology

## 2011-02-20 ENCOUNTER — Telehealth: Payer: Self-pay | Admitting: Gastroenterology

## 2011-02-20 NOTE — Telephone Encounter (Signed)
Reminder in epic to follow up in 6 months and tcs in 10 years

## 2011-02-20 NOTE — Telephone Encounter (Signed)
Results Cc to PCP and Reminders are nicd in the computer

## 2011-02-20 NOTE — Telephone Encounter (Signed)
Please call pt. She had BENIGN POLYP removed from her colon. TCS in 10 years. High fiber diet. OPV IN 6 MOS.

## 2011-02-20 NOTE — Telephone Encounter (Signed)
Pt informed

## 2011-08-08 ENCOUNTER — Encounter: Payer: Self-pay | Admitting: Gastroenterology

## 2011-09-07 ENCOUNTER — Ambulatory Visit (INDEPENDENT_AMBULATORY_CARE_PROVIDER_SITE_OTHER): Payer: Self-pay | Admitting: Gastroenterology

## 2011-09-07 ENCOUNTER — Encounter: Payer: Self-pay | Admitting: Gastroenterology

## 2011-09-07 VITALS — BP 132/83 | HR 60 | Temp 97.5°F | Ht 75.0 in | Wt 332.0 lb

## 2011-09-07 DIAGNOSIS — K921 Melena: Secondary | ICD-10-CM

## 2011-09-07 DIAGNOSIS — K219 Gastro-esophageal reflux disease without esophagitis: Secondary | ICD-10-CM

## 2011-09-07 NOTE — Progress Notes (Signed)
Faxed to PCP

## 2011-09-07 NOTE — Progress Notes (Signed)
Reminder in epic to follow up in 6 months °

## 2011-09-07 NOTE — Assessment & Plan Note (Signed)
RARE.  MONITOR S/SX

## 2011-09-07 NOTE — Assessment & Plan Note (Addendum)
CONTROLLED.  WEIGHT LOSS. LOW FAT DIET. HO GIVEN. PRILOSEC QD OPV IN 6 MOS.

## 2011-09-07 NOTE — Patient Instructions (Signed)
Continue PRILOSEC 30 MINUTES PRIOR TO YOUR FIRST MEAL.  LOSE 1-2 LBS A WEEK.  FOLLOW A LOW FAT DIET.  FOLLOW UP IN 6 MOS.  Low-Fat Diet BREADS, CEREALS, PASTA, RICE, DRIED PEAS, AND BEANS These products are high in carbohydrates and most are low in fat. Therefore, they can be increased in the diet as substitutes for fatty foods. They too, however, contain calories and should not be eaten in excess. Cereals can be eaten for snacks as well as for breakfast.  Include foods that contain fiber (fruits, vegetables, whole grains, and legumes). Research shows that fiber may lower blood cholesterol levels, especially the water-soluble fiber found in fruits, vegetables, oat products, and legumes. FRUITS AND VEGETABLES It is good to eat fruits and vegetables. Besides being sources of fiber, both are rich in vitamins and some minerals. They help you get the daily allowances of these nutrients. Fruits and vegetables can be used for snacks and desserts. MEATS Limit lean meat, chicken, [place], and fish to no more than 6 ounces per day. Beef, Pork, and Lamb Use lean cuts of beef, pork, and lamb. Lean cuts include:  Extra-lean ground beef.  Arm roast.  Sirloin tip.  Center-cut ham.  Round steak.  Loin chops.  Rump roast.  Tenderloin.  Trim all fat off the outside of meats before cooking. It is not necessary to severely decrease the intake of red meat, but lean choices should be made. Lean meat is rich in protein and contains a highly absorbable form of iron. Premenopausal women, in particular, should avoid reducing lean red meat because this could increase the risk for low red blood cells (iron-deficiency anemia).  Chicken and [place] These are good sources of protein. The fat of poultry can be reduced by removing the skin and underlying fat layers before cooking. Chicken and [place] can be substituted for lean red meat in the diet. Poultry should not be fried or covered with high-fat sauces. Fish and  Shellfish Fish is a good source of protein. Shellfish contain cholesterol, but they usually are low in saturated fatty acids. The preparation of fish is important. Like chicken and [place], they should not be fried or covered with high-fat sauces. EGGS Egg whites contain no fat or cholesterol. They can be eaten often. Try 1 to 2 egg whites instead of whole eggs in recipes or use egg substitutes that do not contain yolk.  MILK AND DAIRY PRODUCTS Use skim or 1% milk instead of 2% or whole milk. Decrease whole milk, natural, and processed cheeses. Use nonfat or low-fat (2%) cottage cheese or low-fat cheeses made from vegetable oils. Choose nonfat or low-fat (1 to 2%) yogurt. Experiment with evaporated skim milk in recipes that call for heavy cream. Substitute low-fat yogurt or low-fat cottage cheese for sour cream in dips and salad dressings. Have at least 2 servings of low-fat dairy products, such as 2 glasses of skim (or 1%) milk each day to help get your daily calcium intake.  FATS AND OILS Butterfat, lard, and beef fats are high in saturated fat and cholesterol. These should be avoided.Vegetable fats do not contain cholesterol. AVOID coconut oil, palm oil, and palm kernel oil, WHICH are very high in saturated fats. These should be limited. These fats are often used in bakery goods, processed foods, popcorn, oils, and nondairy creamers. Vegetable shortenings and some peanut butters contain hydrogenated oils, which are also saturated fats. Read the labels on these foods and check for saturated vegetable oils.  Desirable liquid vegetable  oils are corn oil, cottonseed oil, olive oil, canola oil, safflower oil, soybean oil, and sunflower oil. Peanut oil is not as good, but small amounts are acceptable. Buy a heart-healthy tub margarine that has no partially hydrogenated oils in the ingredients. AVOID Mayonnaise and salad dressings often are made from unsaturated fats.  OTHER EATING TIPS Snacks  Most sweets  should be limited as snacks. They tend to be rich in calories and fats, and their caloric content outweighs their nutritional value. Some good choices in snacks are graham crackers, melba toast, soda crackers, bagels (no egg), English muffins, fruits, and vegetables. These snacks are preferable to snack crackers, [place] fries, and chips. Popcorn should be air-popped or cooked in small amounts of liquid vegetable oil.  Desserts Eat fruit, low-fat yogurt, and fruit ices instead of pastries, cake, and cookies. Sherbet, angel food cake, gelatin dessert, frozen low-fat yogurt, or other frozen products that do not contain saturated fat (pure fruit juice bars, frozen ice pops) are also acceptable.   COOKING METHODS Choose those methods that use little or no fat. They include: Poaching.  Braising.  Steaming.  Grilling.  Baking.  Stir-frying.  Broiling.  Microwaving.  Foods can be cooked in a nonstick pan without added fat, or use a nonfat cooking spray in regular cookware. Limit fried foods and avoid frying in saturated fat. Add moisture to lean meats by using water, broth, cooking wines, and other nonfat or low-fat sauces along with the cooking methods mentioned above. Soups and stews should be chilled after cooking. The fat that forms on top after a few hours in the refrigerator should be skimmed off. When preparing meals, avoid using excess salt. Salt can contribute to raising blood pressure in some people.  EATING AWAY FROM HOME Order entres, potatoes, and vegetables without sauces or butter. When meat exceeds the size of a deck of cards (3 to 4 ounces), the rest can be taken home for another meal. Choose vegetable or fruit salads and ask for low-calorie salad dressings to be served on the side. Use dressings sparingly. Limit high-fat toppings, such as bacon, crumbled eggs, cheese, sunflower seeds, and olives. Ask for heart-healthy tub margarine instead of butter.

## 2011-09-07 NOTE — Progress Notes (Signed)
  Subjective:    Patient ID: Greg Hatch., male    DOB: [birth year], 56 y.o.   MRN: [id number]  PCP: [facility], NP-c  HPI PT DOING WELL. WIFE LEFT HIM 1 YEAR AGO. CHANGED CHURCHES AND THE WORD IS BETTER. ATE SPICY FOOD AND SAW BLOOD x1. Heartburn controlled with Prilosec. Sx controlled unless he eats the wrong thing: spicy, spaghetti.  Past Medical History  Diagnosis Date  . HTN (hypertension)   . Heart murmur   . Hyperlipidemia   . GERD (gastroesophageal reflux disease)   . Sleep apnea     pt. states he has been told he has sleep apnea but has never been tested    Past Surgical History  Procedure Date  . Colonoscopy 01/09/2011    Procedure: COLONOSCOPY;  Surgeon: Arlyce Harman, MD;  Location: [facility];  Service: Endoscopy;  Laterality: N/A;  with propofol,1131 in cecum, total withdrawel time 16 minutes  . Polypectomy 02/13/2011    Procedure: POLYPECTOMY;  Surgeon: Arlyce Harman, MD;  Location: [facility];  Service: Endoscopy;;    No Known Allergies  Current Outpatient Prescriptions  Medication Sig Dispense Refill  . atenolol (TENORMIN) 50 MG tablet Take 50 mg by mouth daily.      Marland Kitchen omeprazole (PRILOSEC) 20 MG capsule Take 20 mg by mouth daily.        . pravastatin (PRAVACHOL) 20 MG tablet Take 20 mg by mouth daily.        Marland Kitchen triamterene-hydrochlorothiazide (MAXZIDE-25) 37.5-25 MG per tablet Take 1 tablet by mouth daily.            Review of Systems     Objective:   Physical Exam  Vitals reviewed. Constitutional: He is oriented to person, place, and time. He appears well-developed. No distress.  HENT:  Head: Normocephalic and atraumatic.  Neck: Normal range of motion. Neck supple.  Cardiovascular: Normal rate, regular rhythm and normal heart sounds.   Pulmonary/Chest: Effort normal and breath sounds normal. No respiratory distress.  Abdominal: Soft. Bowel sounds are normal. He exhibits no distension.  Neurological: He is alert and oriented to person, place, and time.   NO FOCAL DEFICITS           Assessment & Plan:

## 2012-02-08 ENCOUNTER — Encounter: Payer: Self-pay | Admitting: Gastroenterology

## 2012-02-28 ENCOUNTER — Encounter: Payer: Self-pay | Admitting: Gastroenterology

## 2012-02-29 ENCOUNTER — Encounter: Payer: Self-pay | Admitting: Gastroenterology

## 2012-02-29 ENCOUNTER — Ambulatory Visit (INDEPENDENT_AMBULATORY_CARE_PROVIDER_SITE_OTHER): Payer: Self-pay | Admitting: Gastroenterology

## 2012-02-29 VITALS — BP 136/78 | HR 47 | Temp 97.4°F | Ht 75.0 in | Wt 310.6 lb

## 2012-02-29 DIAGNOSIS — I10 Essential (primary) hypertension: Secondary | ICD-10-CM

## 2012-02-29 DIAGNOSIS — R001 Bradycardia, unspecified: Secondary | ICD-10-CM

## 2012-02-29 DIAGNOSIS — K219 Gastro-esophageal reflux disease without esophagitis: Secondary | ICD-10-CM

## 2012-02-29 DIAGNOSIS — I498 Other specified cardiac arrhythmias: Secondary | ICD-10-CM

## 2012-02-29 DIAGNOSIS — T50905A Adverse effect of unspecified drugs, medicaments and biological substances, initial encounter: Secondary | ICD-10-CM | POA: Insufficient documentation

## 2012-02-29 NOTE — Assessment & Plan Note (Signed)
ASx-HR 47  TAKE 1/2 YOUR ATENOLOL DAILY.   FOLLOW UP WITH [facility] FOR BP/HR CHECK IN 2 WEEKS.  MAY NEED ALTERNATIVE BP REGIMEN.

## 2012-02-29 NOTE — Progress Notes (Signed)
  Subjective:    Patient ID: Greg Hatch., male    DOB: [birth year], 56 y.o.   MRN: [id number]  PCP: [facility]  HPI LAST SEEN APR 2013. CUT OUT RED MEAT. STRESS IS BETTER. THE MISSES IS GONE. TAKING INDOCIN BID FOR GOUT. HEARTBURN: RARE IF EATS THE WRONG THING. INDIGESTION DEPENDS ON WHAT HE EATS. BMs; GOOD IF EATS RIGHT. LOST 22 LBS SINCE APR. BEEN WALKING AND PLANS TO START RIDING HIS BIKE.  Past Medical History  Diagnosis Date  . HTN (hypertension)   . Heart murmur   . Hyperlipidemia   . GERD (gastroesophageal reflux disease)   . Sleep apnea     pt. states he has been told he has sleep apnea but has never been tested    Past Surgical History  Procedure Date  . Colonoscopy 01/09/2011    Procedure: COLONOSCOPY;  Surgeon: Arlyce Harman, MD;  Location: [facility];  Service: Endoscopy;  Laterality: N/A;  with propofol,1131 in cecum, total withdrawel time 16 minutes  . Polypectomy 02/13/2011    (HYPERPLASTIC POLYPS)small polyps in the rectum/internal hemorhoids/diverticulosis    No Known Allergies  Current Outpatient Prescriptions  Medication Sig Dispense Refill  . atenolol (TENORMIN) 50 MG tablet Take 50 mg by mouth daily.      . indomethacin (INDOCIN) 25 MG capsule Take 25 mg by mouth 2 (two) times daily with a meal. 2 pills tid but only take 2 pills as needed      . pravastatin (PRAVACHOL) 20 MG tablet Take 20 mg by mouth daily.        Marland Kitchen triamterene-hydrochlorothiazide (MAXZIDE-25) 37.5-25 MG per tablet Take 1 tablet by mouth daily.       Marland Kitchen omeprazole (PRILOSEC) 20 MG capsule Take 20 mg by mouth daily.            Review of Systems     Objective:   Physical Exam  Vitals reviewed. Constitutional: He is oriented to person, place, and time. He appears well-nourished. No distress.  HENT:  Head: Normocephalic and atraumatic.  Mouth/Throat: Oropharynx is clear and moist. No oropharyngeal exudate.  Eyes: Pupils are equal, round, and reactive to light. No scleral icterus.  Neck:  Normal range of motion. Neck supple.  Cardiovascular: Normal rate, regular rhythm and normal heart sounds.   Pulmonary/Chest: Effort normal and breath sounds normal. No respiratory distress.  Abdominal: Soft. Bowel sounds are normal. He exhibits no distension. There is no tenderness.  Neurological: He is alert and oriented to person, place, and time.       NO FOCAL DEFICITS   Psychiatric: He has a normal mood and affect.          Assessment & Plan:

## 2012-02-29 NOTE — Progress Notes (Signed)
Faxed to PCP

## 2012-02-29 NOTE — Patient Instructions (Addendum)
TAKE 1/2 YOUR ATENOLOL DAILY. FOLLOW UP WITH [facility] FOR BP/HR CHECK.  YOU MAY USE PRILOSEC AS NEEDED.  CONTINUE A LOW FAT DIET AND YOUR WEIGHT LOSS EFFORTS.  LOSE ANOTHER 20 LBS IN THE NEXT 6 MOS.  FOLLOW UP IN 6 MOS.

## 2012-02-29 NOTE — Assessment & Plan Note (Signed)
Sx IMPROVED AFTER WEIGHT LOSS AND DIET MODIFICATION.  MAY USE PRILOSEC AS NEEDED.  CONTINUE A LOW FAT DIET AND YOUR WEIGHT LOSS EFFORTS.  LOSE ANOTHER 20 LBS IN THE NEXT 6 MOS.  FOLLOW UP IN 6 MOS.

## 2012-03-14 ENCOUNTER — Telehealth: Payer: Self-pay

## 2012-03-14 NOTE — Telephone Encounter (Signed)
Pt came by to get his BP,P, and WT. His BP was 149/85 and Pulse was 51. His WT was 314.4 (but he had on boots)

## 2012-03-18 NOTE — Telephone Encounter (Signed)
REVIEWED.  

## 2012-03-27 NOTE — Progress Notes (Signed)
Reminder in epic to follow up with SF in 6 months in E30 °

## 2012-08-09 ENCOUNTER — Encounter (HOSPITAL_COMMUNITY): Payer: Self-pay | Admitting: Cardiology

## 2012-08-09 ENCOUNTER — Observation Stay (HOSPITAL_COMMUNITY)
Admission: AD | Admit: 2012-08-09 | Discharge: 2012-08-11 | Disposition: A | Discharge status: Home / Self Care | Payer: Self-pay | Source: Other Acute Inpatient Hospital | Attending: Cardiology | Admitting: Cardiology

## 2012-08-09 DIAGNOSIS — I10 Essential (primary) hypertension: Secondary | ICD-10-CM | POA: Diagnosis present

## 2012-08-09 DIAGNOSIS — R739 Hyperglycemia, unspecified: Secondary | ICD-10-CM

## 2012-08-09 DIAGNOSIS — I129 Hypertensive chronic kidney disease with stage 1 through stage 4 chronic kidney disease, or unspecified chronic kidney disease: Secondary | ICD-10-CM | POA: Insufficient documentation

## 2012-08-09 DIAGNOSIS — R001 Bradycardia, unspecified: Secondary | ICD-10-CM

## 2012-08-09 DIAGNOSIS — E785 Hyperlipidemia, unspecified: Secondary | ICD-10-CM | POA: Insufficient documentation

## 2012-08-09 DIAGNOSIS — I498 Other specified cardiac arrhythmias: Secondary | ICD-10-CM | POA: Insufficient documentation

## 2012-08-09 DIAGNOSIS — R0609 Other forms of dyspnea: Secondary | ICD-10-CM | POA: Insufficient documentation

## 2012-08-09 DIAGNOSIS — R7309 Other abnormal glucose: Secondary | ICD-10-CM | POA: Insufficient documentation

## 2012-08-09 DIAGNOSIS — T448X5A Adverse effect of centrally-acting and adrenergic-neuron-blocking agents, initial encounter: Secondary | ICD-10-CM | POA: Insufficient documentation

## 2012-08-09 DIAGNOSIS — R0683 Snoring: Secondary | ICD-10-CM | POA: Diagnosis present

## 2012-08-09 DIAGNOSIS — R0989 Other specified symptoms and signs involving the circulatory and respiratory systems: Secondary | ICD-10-CM | POA: Insufficient documentation

## 2012-08-09 DIAGNOSIS — N183 Chronic kidney disease, stage 3 unspecified: Secondary | ICD-10-CM | POA: Insufficient documentation

## 2012-08-09 DIAGNOSIS — R072 Precordial pain: Principal | ICD-10-CM | POA: Insufficient documentation

## 2012-08-09 LAB — PRO B NATRIURETIC PEPTIDE: Pro B Natriuretic peptide (BNP): 36.3 pg/mL (ref 0–125)

## 2012-08-09 LAB — HEMOGLOBIN A1C
Hgb A1c MFr Bld: 6.4 % — ABNORMAL HIGH (ref <5.7)
Mean Plasma Glucose: 137 mg/dL — ABNORMAL HIGH (ref <117)

## 2012-08-09 LAB — MRSA PCR SCREENING: MRSA by PCR: NEGATIVE

## 2012-08-09 LAB — TSH: TSH: 3.786 u[IU]/mL (ref 0.350–4.500)

## 2012-08-09 MED ORDER — SODIUM CHLORIDE 0.9 % IJ SOLN
3.0000 mL | INTRAMUSCULAR | Status: DC | PRN
  Administered 2012-08-10: 3 mL via INTRAVENOUS

## 2012-08-09 MED ORDER — NITROGLYCERIN 0.4 MG SL SUBL: 0.4000 mg | SUBLINGUAL_TABLET | SUBLINGUAL | Status: DC | PRN

## 2012-08-09 MED ORDER — SODIUM CHLORIDE 0.9 % IV SOLN: 250.0000 mL | INTRAVENOUS | Status: DC | PRN

## 2012-08-09 MED ORDER — NITROGLYCERIN IN D5W 200-5 MCG/ML-% IV SOLN: 2.0000 ug/min | INTRAVENOUS | Status: DC

## 2012-08-09 MED ORDER — ONDANSETRON HCL 4 MG/2ML IJ SOLN: 4.0000 mg | Freq: Four times a day (QID) | INTRAMUSCULAR | Status: DC | PRN

## 2012-08-09 MED ORDER — ASPIRIN 300 MG RE SUPP
300.0000 mg | RECTAL | Status: AC
Start: 2012-08-09 — End: 2012-08-09
  Filled 2012-08-09: qty 1

## 2012-08-09 MED ORDER — SODIUM CHLORIDE 0.9 % IJ SOLN
3.0000 mL | Freq: Two times a day (BID) | INTRAMUSCULAR | Status: DC
  Administered 2012-08-09 – 2012-08-11 (×3): 3 mL via INTRAVENOUS

## 2012-08-09 MED ORDER — ASPIRIN 81 MG PO CHEW
324.0000 mg | CHEWABLE_TABLET | ORAL | Status: AC
Start: 2012-08-09 — End: 2012-08-09
  Administered 2012-08-09: 324 mg via ORAL
  Filled 2012-08-09: qty 4

## 2012-08-09 MED ORDER — HEPARIN BOLUS VIA INFUSION
4000.0000 [IU] | Freq: Once | INTRAVENOUS | Status: AC
  Administered 2012-08-09: 4000 [IU] via INTRAVENOUS
  Filled 2012-08-09: qty 4000

## 2012-08-09 MED ORDER — ASPIRIN EC 81 MG PO TBEC
81.0000 mg | DELAYED_RELEASE_TABLET | Freq: Every day | ORAL | Status: DC
  Administered 2012-08-10 – 2012-08-11 (×2): 81 mg via ORAL
  Filled 2012-08-09 (×2): qty 1

## 2012-08-09 MED ORDER — ATORVASTATIN CALCIUM 80 MG PO TABS
80.0000 mg | ORAL_TABLET | Freq: Every day | ORAL | Status: DC
  Administered 2012-08-09 – 2012-08-10 (×2): 80 mg via ORAL
  Filled 2012-08-09 (×3): qty 1

## 2012-08-09 MED ORDER — HEPARIN (PORCINE) IN NACL 100-0.45 UNIT/ML-% IJ SOLN
1900.0000 [IU]/h | INTRAMUSCULAR | Status: DC
  Administered 2012-08-09: 1450 [IU]/h via INTRAVENOUS
  Administered 2012-08-10: 1900 [IU]/h via INTRAVENOUS
  Filled 2012-08-09 (×3): qty 250

## 2012-08-09 MED ORDER — ACETAMINOPHEN 325 MG PO TABS
650.0000 mg | ORAL_TABLET | ORAL | Status: DC | PRN
  Administered 2012-08-10: 650 mg via ORAL
  Filled 2012-08-09: qty 2

## 2012-08-09 NOTE — Progress Notes (Signed)
ANTICOAGULATION CONSULT NOTE - Initial Consult  Pharmacy Consult for Heparin Indication: chest pain/ACS  No Known Allergies  Patient Measurements: Height: 6\' 3"  (190.5 cm) Weight: 320 lb (145.151 kg) (3 weeks ago per patient) IBW/kg (Calculated) : 84.5 Heparin Dosing Weight: 117.5 kg  Vital Signs: BP: 147/94 mmHg (03/28 1700) Pulse Rate: 62 (03/28 1700)  Labs: [facility]: SCr 2.17 K 4.1 WBC 10.1 H/H 13.2/41.9 Platelet 189 INR 1.1 AST/ALT 59/37 Troponin <0.01  Estimated Creatinine Clearance: 86.2 ml/min (by C-G formula based on Cr of 1.49).   Medical History: Past Medical History  Diagnosis Date  . HTN (hypertension)   . Hyperlipidemia   . GERD (gastroesophageal reflux disease)   . Sleep apnea     No formal diagnosis    Medications:  Prescriptions prior to admission  Medication Sig Dispense Refill  . atenolol (TENORMIN) 50 MG tablet Take 50 mg by mouth daily.      . indomethacin (INDOCIN) 25 MG capsule Take 25 mg by mouth 2 (two) times daily as needed (gout flare).       Marland Kitchen omeprazole (PRILOSEC) 20 MG capsule Take 20 mg by mouth daily as needed (acid reflux).       . triamterene-hydrochlorothiazide (MAXZIDE-25) 37.5-25 MG per tablet Take 1 tablet by mouth daily.         Assessment: 57 y/o male who woke up this morning with chest pain and was transferred via EMS to [facility]. He was started on heparin with 5000 unit IV bolus at 05:23 and infusion at 1000 units/hr at 05:30. [place] drew a heparin level which was 0.11 at 14:30 before he left but the rate was not adjusted. No bleeding noted, CBC is wnl. Plan is for a stress perfusion study.  Goal of Therapy:  Heparin level 0.3-0.7 units/ml Monitor platelets by anticoagulation protocol: Yes   Plan:  -Heparin 4000 units IV bolus then increase infusion to 1450 units/hr -Heparin level 6 hours after rate change -Daily heparin level and CBC while on heparin -Monitor for signs/symptoms of  bleeding  [facility], Camilla.D., BCPS Clinical Pharmacist Pager: [phone] 08/09/2012 5:47 PM

## 2012-08-09 NOTE — H&P (Signed)
CARDIOLOGY ADMISSION NOTE  Patient ID: Greg Gates. MRN: [id number] DOB/AGE: 01-24-1956 57 y.o.  Admit date: 08/09/2012 Primary Physician   None Primary Cardiologist   None Chief Complaint    Chest pain   HPI:  The patient has no prior cardiac history. He has been managed for hypertension. He hasn't actually taken his medications and doesn't routinely get medical care because of financial issues. He thinks he has sleep apnea. He knows he's had a low heart rate and was actually told to reduce his dose of beta blocker which she has not been taking any way. He actually did get this filled and has been taking a low dose for the last couple of days. He's never had prior heart testing.  Couple of days ago he developed some diarrhea and cold chills with low-grade fever. At 3 AM this morning he woke with chest discomfort. He described it is 8/10. He felt like he had to burp. He called his neighbor who called EMS. He apparently was treated with nitroglycerin sublingual and aspirin via EMS. He had resolution of his pain in about 15 minutes. He's had no recurrence of this. He did not describe nausea or vomiting. He didn't describe radiation to his neck or to his arms. He's not had any palpitations, presyncope or syncope. He's had no recent weight gain or edema. He does get dyspneic walking up the hill near his apartment.  However, has not had PND or orthopnea   Past Medical History  Diagnosis Date  . HTN (hypertension)   . Hyperlipidemia   . GERD (gastroesophageal reflux disease)   . Sleep apnea     No formal diagnosis    Past Surgical History  Procedure Laterality Date  . Colonoscopy  01/09/2011    Procedure: COLONOSCOPY;  Surgeon: Greg Harman, MD;  Location: [facility];  Service: Endoscopy;  Laterality: N/A;  with propofol,1131 in cecum, total withdrawel time 16 minutes  . Polypectomy  02/13/2011    (HYPERPLASTIC POLYPS)small polyps in the rectum/internal hemorhoids/diverticulosis    No  Known Allergies No current facility-administered medications on file prior to encounter.   Current Outpatient Prescriptions on File Prior to Encounter  Medication Sig Dispense Refill  . atenolol (TENORMIN) 50 MG tablet Has not been taking routinely      . omeprazole (PRILOSEC) 20 MG capsule Take 20 mg by mouth prn.        . triamterene-hydrochlorothiazide (MAXZIDE-25) 37.5-25 MG per tablet Take 1 tablet by mouth daily.        History   Social History  . Marital Status: Legally Separated    Spouse Name: N/A    Number of Children: 1  . Years of Education: N/A   Occupational History  . unemployed     previously worked on [employer]   Social History Main Topics  . Smoking status: Former Smoker -- 1.00 packs/day for 25 years    Types: Cigarettes    Quit date: 12/20/2000  . Smokeless tobacco: Not on file  . Alcohol Use: Yes     Comment: daily etoh until 2002, now rarely  . Drug Use: Yes     Comment: quit 2002- acid, marijuana, cocaine, crack, hash   . Sexually Active: Yes    Birth Control/ Protection: None   Other Topics Concern  . Not on file   Social History Narrative   1 Grown biological son & step-daughter   Lives alone    Family History  Problem Relation Age of  Onset  . Diabetes Father   . Peripheral vascular disease Father   . Prostate cancer Father     uncle & cousin  . Anesthesia problems Neg Hx   . Hypotension Neg Hx   . Malignant hyperthermia Neg Hx   . Pseudochol deficiency Neg Hx   . Diabetes Mother     ROS:  As stated in the HPI and negative for all other systems.  Physical Exam: Blood pressure 149/92, pulse 61, resp. rate 17, SpO2 99.00%.  GENERAL:  Well appearing HEENT:  Pupils equal round and reactive, fundi not visualized, oral mucosa unremarkable NECK:  No jugular venous distention, waveform within normal limits, carotid upstroke brisk and symmetric, no bruits, no thyromegaly LYMPHATICS:  No cervical, inguinal adenopathy LUNGS:  Clear to  auscultation bilaterally BACK:  No CVA tenderness CHEST:  Unremarkable HEART:  PMI not displaced or sustained,S1 and S2 within normal limits, no S3, no S4, no clicks, no rubs, no murmurs ABD:  Flat, positive bowel sounds normal in frequency in pitch, no bruits, no rebound, no guarding, no midline pulsatile mass, no hepatomegaly, no splenomegaly EXT:  2 plus pulses throughout, no edema, no cyanosis no clubbing SKIN:  No rashes no nodules NEURO:  Cranial nerves II through XII grossly intact, motor grossly intact throughout PSYCH:  Cognitively intact, oriented to person place and time  Labs:   ALT 37, AST 59, BUN 35, calcium 8.0, creatinine 2.17, d-dimer 0.61, hemoglobin 13.2, platelets 189, potassium 141, sodium 142, CBC 10.1, troponin negative x2.   Radiology:  CXR:  Low lung volumes.  No acute abnormalities.    EKG:  Sinus rhythm, rate 62, axis within normal limits, intervals within normal limits, no acute ST-T wave changes.  ASSESSMENT AND PLAN:    CHEST PAIN:  Chest pain is somewhat atypical. However, he does have cardiovascular risk factors. I will likely screen him with a stress perfusion study he has no further symptoms. I would like to try ovoid catheterization with his renal insufficiency.  CKD:  The patient has elevated creatinine which is new to him. I will follow this. I suspect this is related to hyperglycemia hypertension.  HYPERGLYCEMIA:  His blood sugar is elevated and I suspect he has diabetes. We'll check a hemoglobin A1c.  BRADYCARDIA:  The patient has not been taking his atenolol. I will actually avoid this medication for now as he does seem to have a baseline bradycardia.  HTN:   blood pressure is elevated. For now we'll use IV nitroglycerin.   a likely his Norvasc given his renal insufficiency and his bradycardia.   OBESITY:  We discussed this.  SNORING:   I suspect he has sleep apnea which would be an outpatient workup.   SignedLadarian Gates 08/09/2012, 4:54  PM

## 2012-08-10 ENCOUNTER — Observation Stay (HOSPITAL_COMMUNITY): Payer: Self-pay

## 2012-08-10 LAB — TROPONIN I: Troponin I: <0.3 ng/mL (ref <0.30)

## 2012-08-10 LAB — CBC
MCH: 26 pg (ref 26.0–34.0)
MCHC: 32.9 g/dL (ref 30.0–36.0)
Platelets: 188 10*3/uL (ref 150–400)
RDW: 15.8 % — ABNORMAL HIGH (ref 11.5–15.5)

## 2012-08-10 LAB — BASIC METABOLIC PANEL
Calcium: 8.4 mg/dL (ref 8.4–10.5)
Creatinine, Ser: 1.49 mg/dL — ABNORMAL HIGH (ref 0.50–1.35)
GFR calc non Af Amer: 51 mL/min — ABNORMAL LOW (ref >90)
Glucose, Bld: 96 mg/dL (ref 70–99)
Sodium: 136 mEq/L (ref 135–145)

## 2012-08-10 LAB — LIPID PANEL: Cholesterol: 124 mg/dL (ref 0–200)

## 2012-08-10 MED ORDER — WHITE PETROLATUM GEL
Status: AC
  Administered 2012-08-10: 10:00:00
  Filled 2012-08-10: qty 5

## 2012-08-10 MED ORDER — HEPARIN BOLUS VIA INFUSION
3000.0000 [IU] | Freq: Once | INTRAVENOUS | Status: AC
  Administered 2012-08-10: 3000 [IU] via INTRAVENOUS
  Filled 2012-08-10: qty 3000

## 2012-08-10 MED ORDER — AMLODIPINE BESYLATE 5 MG PO TABS
5.0000 mg | ORAL_TABLET | Freq: Every day | ORAL | Status: DC
  Administered 2012-08-10 – 2012-08-11 (×2): 5 mg via ORAL
  Filled 2012-08-10 (×2): qty 1

## 2012-08-10 NOTE — Progress Notes (Signed)
SUBJECTIVE:  No SOB.  No chest pain.   PHYSICAL EXAM Filed Vitals:   08/10/12 0000 08/10/12 0341 08/10/12 0755 08/10/12 0800  BP:    126/73  Pulse: 61   58  Temp:  97.8 F (36.6 C) 98.8 F (37.1 C)   TempSrc:  Oral Oral   Resp: 24   19  Height:      Weight:      SpO2: 96%   98%   General:  No distress Lungs:  clear Heart:  RRR Abdomen:  Positive bowel sounds, no rebound no guarding Extremities:  No edema  LABS: Lab Results  Component Value Date   TROPONINI <0.30 08/10/2012   Results for orders placed during the hospital encounter of 08/09/12 (from the past 24 hour(s))  MRSA PCR SCREENING     Status: None   Collection Time    08/09/12  4:28 PM      Result Value Range   MRSA by PCR NEGATIVE  NEGATIVE  TSH     Status: None   Collection Time    08/09/12  6:03 PM      Result Value Range   TSH 3.786  0.350 - 4.500 uIU/mL  HEMOGLOBIN A1C     Status: Abnormal   Collection Time    08/09/12  6:03 PM      Result Value Range   Hemoglobin A1C 6.4 (*) <5.7 %   Mean Plasma Glucose 137 (*) <117 mg/dL  TROPONIN I     Status: None   Collection Time    08/09/12  6:04 PM      Result Value Range   Troponin I <0.30  <0.30 ng/mL  PRO B NATRIURETIC PEPTIDE     Status: None   Collection Time    08/09/12  6:04 PM      Result Value Range   Pro B Natriuretic peptide (BNP) 36.3  0 - 125 pg/mL  TROPONIN I     Status: None   Collection Time    08/09/12 11:30 PM      Result Value Range   Troponin I <0.30  <0.30 ng/mL  HEPARIN LEVEL (UNFRACTIONATED)     Status: Abnormal   Collection Time    08/09/12 11:30 PM      Result Value Range   Heparin Unfractionated 0.14 (*) 0.30 - 0.70 IU/mL  CBC     Status: Abnormal   Collection Time    08/09/12 11:30 PM      Result Value Range   WBC 7.4  4.0 - 10.5 K/uL   RBC 4.89  4.22 - 5.81 MIL/uL   Hemoglobin 12.7 (*) 13.0 - 17.0 g/dL   HCT 16.1 (*) 09.6 - 04.5 %   MCV 78.9  78.0 - 100.0 fL   MCH 26.0  26.0 - 34.0 pg   MCHC 32.9  30.0 - 36.0  g/dL   RDW 40.9 (*) 81.1 - 91.4 %   Platelets 188  150 - 400 K/uL  TROPONIN I     Status: None   Collection Time    08/10/12  5:02 AM      Result Value Range   Troponin I <0.30  <0.30 ng/mL  LIPID PANEL     Status: Abnormal   Collection Time    08/10/12  5:02 AM      Result Value Range   Cholesterol 124  0 - 200 mg/dL   Triglycerides 782  <956 mg/dL   HDL 25 (*) >21 mg/dL  Total CHOL/HDL Ratio 5.0     VLDL 23  0 - 40 mg/dL   LDL Cholesterol 76  0 - 99 mg/dL    Intake/Output Summary (Last 24 hours) at 08/10/12 0928 Last data filed at 08/10/12 0900  Gross per 24 hour  Intake 1327.5 ml  Output   1450 ml  Net -122.5 ml    EKG:   NSR, rate 57, no acute ST T wave changes.  08/10/2012  ASSESSMENT AND PLAN:  CHEST PAIN:  Ruled out.  Stress test today.   CKD:  Creat pending  HYPERGLYCEMIA:  A1c is borderline.  Needs diet control.   BRADYCARDIA:  Avoiding beta blocker.  HTN:  BP OK.  I will stop the IV NTG and start Norvasc.   OBESITY:  Needs weight loss with diet and exercise.    Thijs [facility] 08/10/2012 9:28 AM

## 2012-08-10 NOTE — Progress Notes (Signed)
ANTICOAGULATION CONSULT NOTE - Follow Up Consult  Pharmacy Consult for heparin Indication: chest pain/ACS  Labs:  Recent Labs  08/09/12 1804 08/09/12 2330  HGB  --  12.7*  HCT  --  38.6*  PLT  --  188  HEPARINUNFRC  --  0.14*  TROPONINI <0.30  --     Assessment: 56yo male subtherapeutic on heparin with initial dosing for CP; lab drawn ~1hr early.  Goal of Therapy:  Heparin level 0.3-0.7 units/ml   Plan:  Will rebolus with 3000 units of heparin and increase gtt by 4 units/kg/hr to 1900 units/hr and check level in 6hr.  Vernard Gambles, PharmD, BCPS  08/10/2012,12:57 AM

## 2012-08-10 NOTE — Progress Notes (Signed)
UR completed 

## 2012-08-11 ENCOUNTER — Observation Stay (HOSPITAL_COMMUNITY): Payer: Self-pay

## 2012-08-11 LAB — CBC
MCH: 25 pg — ABNORMAL LOW (ref 26.0–34.0)
MCHC: 31.8 g/dL (ref 30.0–36.0)
Platelets: 221 10*3/uL (ref 150–400)
RBC: 5.21 MIL/uL (ref 4.22–5.81)
RDW: 15.5 % (ref 11.5–15.5)

## 2012-08-11 MED ORDER — TECHNETIUM TC 99M SESTAMIBI - CARDIOLITE
30.0000 | Freq: Once | INTRAVENOUS | Status: AC | PRN
  Administered 2012-08-11: 10:00:00 30 via INTRAVENOUS

## 2012-08-11 MED ORDER — AMLODIPINE BESYLATE 5 MG PO TABS: 5.0000 mg | ORAL_TABLET | Freq: Every day | ORAL | Status: DC

## 2012-08-11 MED ORDER — REGADENOSON 0.4 MG/5ML IV SOLN
0.4000 mg | Freq: Once | INTRAVENOUS | Status: AC
  Administered 2012-08-11: 0.4 mg via INTRAVENOUS
  Filled 2012-08-11: qty 5

## 2012-08-11 MED ORDER — TECHNETIUM TC 99M SESTAMIBI - CARDIOLITE
30.0000 | Freq: Once | INTRAVENOUS | Status: AC | PRN
  Administered 2012-08-10: 12:00:00 30 via INTRAVENOUS

## 2012-08-11 MED ORDER — ASPIRIN 81 MG PO TBEC: 81.0000 mg | DELAYED_RELEASE_TABLET | Freq: Every day | ORAL | Status: DC

## 2012-08-11 MED ORDER — ATENOLOL 50 MG PO TABS: 50.0000 mg | ORAL_TABLET | Freq: Every day | ORAL | Status: DC

## 2012-08-11 NOTE — Progress Notes (Signed)
SUBJECTIVE:  No chest pain or SOB   PHYSICAL EXAM Filed Vitals:   08/10/12 1400 08/10/12 2100 08/11/12 0500 08/11/12 0910  BP: 156/91 146/85 148/77 149/93  Pulse: 63 58 60 65  Temp: 98.1 F (36.7 C) 98.7 F (37.1 C) 98.5 F (36.9 C)   TempSrc: Oral Oral Oral   Resp: 20 20 20    Height:      Weight:   318 lb (144.244 kg)   SpO2: 98% 97% 96%    General: Well developed, well nourished, male in no acute distress Lungs:   Clear bilaterally to auscultation and percussion. Heart: HRRR S1 S2, without MRG.  Pulses are 2+ & equal. Abdomen: Bowel sounds are positive, abdomen soft and non-tender without masses or                  Hernias noted. Extremities:   No clubbing, cyanosis or edema. Head: Eyes PERRLA, No xanthomas.   Normocephalic and atramatic     Neuro: Alert and oriented X 3. Psych:  Good affect, responds appropriately  LABS: Lab Results  Component Value Date   TROPONINI <0.30 08/10/2012   Results for orders placed during the hospital encounter of 08/09/12 (from the past 24 hour(s))  BASIC METABOLIC PANEL     Status: Abnormal   Collection Time    08/10/12 10:19 AM      Result Value Range   Sodium 136  135 - 145 mEq/L   Potassium 3.9  3.5 - 5.1 mEq/L   Chloride 105  96 - 112 mEq/L   CO2 21  19 - 32 mEq/L   Glucose, Bld 96  70 - 99 mg/dL   BUN 24 (*) 6 - 23 mg/dL   Creatinine, Ser 9.81 (*) 0.50 - 1.35 mg/dL   Calcium 8.4  8.4 - 19.1 mg/dL   GFR calc non Af Amer 51 (*) >90 mL/min   GFR calc Af Amer 59 (*) >90 mL/min  CBC     Status: Abnormal   Collection Time    08/11/12  6:38 AM      Result Value Range   WBC 6.3  4.0 - 10.5 K/uL   RBC 5.21  4.22 - 5.81 MIL/uL   Hemoglobin 13.0  13.0 - 17.0 g/dL   HCT 47.8  29.5 - 62.1 %   MCV 78.5  78.0 - 100.0 fL   MCH 25.0 (*) 26.0 - 34.0 pg   MCHC 31.8  30.0 - 36.0 g/dL   RDW 30.8  65.7 - 84.6 %   Platelets 221  150 - 400 K/uL   Lab Results  Component Value Date   HGBA1C 6.4* 08/09/2012    Intake/Output Summary  (Last 24 hours) at 08/11/12 0925 Last data filed at 08/10/12 1300  Gross per 24 hour  Intake    363 ml  Output    225 ml  Net    138 ml    EKG:  10-Aug-2012 09:06:57  Sinus bradycardia Otherwise normal ECG Since last tracing rate slower 29mm/s 54mm/mV 100Hz  8.0.1 12SL 241 HD CID: [id number] Referred by: J Sephiroth Mcluckie Confirmed By: Lance Muss MD Vent. rate 57 BPM PR interval 190 ms QRS duration 94 ms QT/QTc 414/402 ms P-R-T axes -6 21 29   ASSESSMENT AND PLAN:  Principal Problem:   Precordial pain - Enzymes negative for MI, Lexiscan MV  - 2-day study, stress today.  Active Problems:   Bradycardia, drug induced - HR has improved   HTN (hypertension) - SBP 140s  Snoring - has not had sleep study   Morbid obesity - encouraged HH diet.   Hyperglycemia - A1c is slightly elevated, discussed with patient, he is willing to go on a diabetic diet. We'll check blood sugars and add sliding scale if needed.   CKD (chronic kidney disease) stage 3, GFR 30-59 ml/min - follow    Theodore Demark 08/11/2012 9:25 AM  History and all data above reviewed.  Patient examined.  I agree with the findings as above.  No further chest pain.  No SOB.  The patient exam reveals COR:RRR  ,  Lungs: Clear  ,  Abd: Positive bowel sounds, no rebound no guarding, Ext No edema  .  All available labs, radiology testing, previous records reviewed. Agree with documented assessment and plan. OK to discharge if stress test OK.  Continue current meds.  Follow up with primary provider for HTN and CKD Stage II.    Clever Jaeliana Lococo  11:22 AM  08/11/2012

## 2012-08-11 NOTE — Progress Notes (Signed)
Utilization review completed.  

## 2012-08-11 NOTE — Progress Notes (Signed)
Lexiscan Myoview performed - 2-day study completes 3/31.

## 2012-08-11 NOTE — Discharge Summary (Signed)
CARDIOLOGY DISCHARGE SUMMARY   Patient ID: Greg Gates. MRN: [id number] DOB/AGE: [birth year] 56 y.o.  Admit date: 08/09/2012 Discharge date: 08/11/2012  Primary Discharge Diagnosis:   Precordial pain  Secondary Discharge Diagnosis:    Bradycardia, drug induced   HTN (hypertension)   Snoring   Morbid obesity   Hyperglycemia   CKD (chronic kidney disease) stage 3, GFR 30-59 ml/min  Procedures: Lexi scan [facility] Course: Greg Gates. is a 56 y.o. male with no history of CAD. He was awakened by chest pain on the day of admission. EMS was called and treated him with nitroglycerin and aspirin. His pain resolved. He was admitted for further evaluation and treatment.  His cardiac enzymes were negative for MI. He had no further episodes of chest pain. His labs were reviewed and his hemoglobin A1c was slightly elevated. The situation was discussed with the patient who was aware he is a borderline diabetic. He was encouraged to go on a diabetic diet and follow up with a family physician. He was hypertensive on admission and was started on his previous home medications. He will be given prescriptions for the atenolol and Norvasc at discharge.  He has multiple cardiac risk factors so a Lexi scan Myoview was performed. The full results are below but it did not show ischemia or infarction. His EF is preserved.  On 08/11/2012, Greg Gates was ambulating without chest pain or shortness of breath. He was evaluated by Dr. Antoine Poche and considered stable for discharge, to follow up as an outpatient with primary care and with cardiology when necessary.  Labs:   Lab Results  Component Value Date   WBC 6.3 08/11/2012   HGB 13.0 08/11/2012   HCT 40.9 08/11/2012   MCV 78.5 08/11/2012   PLT 221 08/11/2012     Recent Labs Lab 08/10/12 1019  NA 136  K 3.9  CL 105  CO2 21  BUN 24*  CREATININE 1.49*  CALCIUM 8.4  GLUCOSE 96    Recent Labs  08/09/12 1804 08/09/12 2330  08/10/12 0502  TROPONINI <0.30 <0.30 <0.30   Lipid Panel     Component Value Date/Time   CHOL 124 08/10/2012 0502   TRIG 115 08/10/2012 0502   HDL 25* 08/10/2012 0502   CHOLHDL 5.0 08/10/2012 0502   VLDL 23 08/10/2012 0502   LDLCALC 76 08/10/2012 0502   Pro B Natriuretic peptide (BNP)  Date/Time Value Range Status  08/09/2012  6:04 PM 36.3  0 - 125 pg/mL Final   Lab Results  Component Value Date   HGBA1C 6.4* 08/09/2012   Radiology: Nm Myocar Multi W/spect W/wall Motion / Ef 08/11/2012  *RADIOLOGY REPORT*  Clinical Data:  Chest pain  MYOCARDIAL IMAGING WITH SPECT (REST AND PHARMACOLOGIC-STRESS - 2 DAY PROTOCOL) GATED LEFT VENTRICULAR WALL MOTION STUDY LEFT VENTRICULAR EJECTION FRACTION  Technique:  Standard myocardial SPECT imaging was performed after intravenous injection of 30.7 mCi Tc-38m sestamibi at rest.  On a different day, intravenous infusion of  Lexiscan was performed under supervision of the Cardiology staff.  At peak effect of the drug, 32.4 mCi Tc-61m sestamibi was injected intravenously and standard myocardial SPECT imaging was performed.  Quantitative gated imaging was also performed to evaluate left ventricular wall motion and estimate left ventricular ejection fraction.  Comparison:  None.  Findings:  Spect:  No definite inducible or reversible ischemia with pharmacologic stress. No fixed defects.  Grossly normal wall motion  Calculated Q G S ejection fraction is 58%.  IMPRESSION: Negative for inducible ischemia with pharmacologic stress.   Original Report Authenticated By: Judie Petit. Shick, M.D.    EKG: 10-Aug-2012 09:06:57  Sinus bradycardia Otherwise normal ECG Since last tracing rate slower Vent. rate 57 BPM PR interval 190 ms QRS duration 94 ms QT/QTc 414/402 ms P-R-T axes -6 21 29   FOLLOW UP PLANS AND APPOINTMENTS No Known Allergies   Medication List    TAKE these medications       amLODipine 5 MG tablet  Commonly known as:  NORVASC  Take 1 tablet (5 mg total) by  mouth daily.     aspirin 81 MG EC tablet  Take 1 tablet (81 mg total) by mouth daily.     atenolol 50 MG tablet  Commonly known as:  TENORMIN  Take 1 tablet (50 mg total) by mouth daily.     indomethacin 25 MG capsule  Commonly known as:  INDOCIN  Take 25 mg by mouth 2 (two) times daily as needed (gout flare).     omeprazole 20 MG capsule  Commonly known as:  PRILOSEC  Take 20 mg by mouth daily as needed (acid reflux).     triamterene-hydrochlorothiazide 37.5-25 MG per tablet  Commonly known as:  MAXZIDE-25  Take 1 tablet by mouth daily.        Follow-up Information   Follow up with Rollene Rotunda, MD. (As needed)    Contact information:   [address] [place] [address] [phone]       BRING ALL MEDICATIONS WITH YOU TO FOLLOW UP APPOINTMENTS  Time spent with patient to include physician time: 38 min Signed: Theodore Demark, PA-C 08/11/2012, 3:01 PM Co-Sign MD

## 2012-09-26 ENCOUNTER — Encounter: Payer: Self-pay | Admitting: Gastroenterology

## 2012-10-14 ENCOUNTER — Encounter: Payer: Self-pay | Admitting: Gastroenterology

## 2012-10-16 ENCOUNTER — Ambulatory Visit (INDEPENDENT_AMBULATORY_CARE_PROVIDER_SITE_OTHER): Payer: PRIVATE HEALTH INSURANCE | Admitting: Gastroenterology

## 2012-10-16 ENCOUNTER — Encounter: Payer: Self-pay | Admitting: Gastroenterology

## 2012-10-16 VITALS — BP 114/79 | HR 51 | Temp 98.4°F | Ht 75.0 in | Wt 291.6 lb

## 2012-10-16 DIAGNOSIS — K219 Gastro-esophageal reflux disease without esophagitis: Secondary | ICD-10-CM | POA: Insufficient documentation

## 2012-10-16 NOTE — Patient Instructions (Addendum)
1. Continue Prilosec as needed. 2. Continue the good work your doing with your weight loss. Try to lose additional 25-30 pounds before your next office visit. 3. Office visit with Dr. Darrick Penna in one year.

## 2012-10-16 NOTE — Assessment & Plan Note (Signed)
Doing very well. Continues to lose weight. Major dietary changes which is benefited his weight and his reflux. He will continue Prilosec as needed. Try to lose 25-30 additional pounds before his next office visit in one year with Dr. Darrick Penna.

## 2012-10-16 NOTE — Progress Notes (Signed)
Primary Care Physician: Kirstie Peri, MD  Primary Gastroenterologist:  Jonette Eva, MD   Chief Complaint  Patient presents with  . Follow-up    HPI: Greg Gates. is a 57 y.o. male here for followup visit. He was last seen in October 2013. He has a history of GERD and obesity. Got a job at [employer]. Watching what he eats. Eating lot of turkey/chicken. Rarely eats red meat. Lot of salad and fruit. Still has some sweets/bread. Weight down from 320 a couple of months ago after starting new job. Walking main means of transportation. Prilosec less than 1-2 times per week. Saw dietician at work. BM regular. No melena, brbpr. No chronic significant GERD to warrant EGD.  Current Outpatient Prescriptions  Medication Sig Dispense Refill  . amLODipine (NORVASC) 5 MG tablet Take 1 tablet (5 mg total) by mouth daily.  30 tablet  11  . aspirin EC 81 MG EC tablet Take 1 tablet (81 mg total) by mouth daily.      Marland Kitchen atenolol (TENORMIN) 50 MG tablet Take 1 tablet (50 mg total) by mouth daily.  30 tablet  11  . indomethacin (INDOCIN) 25 MG capsule Take 25 mg by mouth 2 (two) times daily as needed (gout flare).       Marland Kitchen omeprazole (PRILOSEC) 20 MG capsule Take 20 mg by mouth daily as needed (acid reflux).       . triamterene-hydrochlorothiazide (MAXZIDE-25) 37.5-25 MG per tablet Take 1 tablet by mouth daily.        No current facility-administered medications for this visit.    Allergies as of 10/16/2012  . (No Known Allergies)    ROS:  General: Negative for anorexia, unintentional weight loss, fever, chills, fatigue, weakness. ENT: Negative for hoarseness, difficulty swallowing , nasal congestion. CV: Negative for chest pain, angina, palpitations, dyspnea on exertion, peripheral edema.  Respiratory: Negative for dyspnea at rest, dyspnea on exertion, cough, sputum, wheezing.  GI: See history of present illness. GU:  Negative for dysuria, hematuria, urinary incontinence, urinary frequency, nocturnal  urination.  Endo: Negative for unusual weight change.    Physical Examination:   BP 114/79  Pulse 51  Temp(Src) 98.4 F (36.9 C) (Oral)  Ht 6\' 3"  (1.905 m)  Wt 291 lb 9.6 oz (132.269 kg)  BMI 36.45 kg/m2  General: Well-nourished, well-developed in no acute distress.  Eyes: No icterus. Mouth: Oropharyngeal mucosa moist and pink , no lesions erythema or exudate. Lungs: Clear to auscultation bilaterally.  Heart: Regular rate and rhythm, no murmurs rubs or gallops.  Abdomen: Bowel sounds are normal, nontender, nondistended, no hepatosplenomegaly or masses, no abdominal bruits or hernia , no rebound or guarding.   Extremities: No lower extremity edema. No clubbing or deformities. Neuro: Alert and oriented x 4   Skin: Warm and dry, no jaundice.   Psych: Alert and cooperative, normal mood and affect.

## 2012-10-16 NOTE — Progress Notes (Signed)
Cc PCP 

## 2013-05-16 NOTE — Progress Notes (Signed)
REVIEWED.  

## 2013-09-09 ENCOUNTER — Telehealth: Payer: Self-pay

## 2013-09-09 NOTE — Telephone Encounter (Signed)
[employer] and pt has never been seen at [place]. I could not find him in the system with DOB.

## 2013-09-09 NOTE — Telephone Encounter (Signed)
**Note De-Identified Greg Gates Obfuscation** I cant see where the pt ever saw Dr Ron Parker in the office or the hospital.

## 2013-09-10 ENCOUNTER — Other Ambulatory Visit: Payer: Self-pay

## 2013-09-10 MED ORDER — AMLODIPINE BESYLATE 5 MG PO TABS: 5.0000 mg | ORAL_TABLET | Freq: Every day | ORAL | Status: AC

## 2013-09-10 NOTE — Telephone Encounter (Signed)
Yes.  Please schedule follow up.

## 2013-09-18 ENCOUNTER — Ambulatory Visit (INDEPENDENT_AMBULATORY_CARE_PROVIDER_SITE_OTHER): Payer: BC Managed Care – PPO | Admitting: Cardiology

## 2013-09-18 ENCOUNTER — Encounter: Payer: Self-pay | Admitting: Cardiology

## 2013-09-18 VITALS — BP 149/92 | HR 61 | Ht 75.0 in | Wt 312.0 lb

## 2013-09-18 DIAGNOSIS — I498 Other specified cardiac arrhythmias: Secondary | ICD-10-CM

## 2013-09-18 DIAGNOSIS — Z79899 Other long term (current) drug therapy: Secondary | ICD-10-CM

## 2013-09-18 DIAGNOSIS — N183 Chronic kidney disease, stage 3 unspecified: Secondary | ICD-10-CM

## 2013-09-18 DIAGNOSIS — R072 Precordial pain: Secondary | ICD-10-CM

## 2013-09-18 DIAGNOSIS — R739 Hyperglycemia, unspecified: Secondary | ICD-10-CM

## 2013-09-18 DIAGNOSIS — R7309 Other abnormal glucose: Secondary | ICD-10-CM

## 2013-09-18 DIAGNOSIS — I10 Essential (primary) hypertension: Secondary | ICD-10-CM

## 2013-09-18 MED ORDER — LISINOPRIL 5 MG PO TABS: 5.0000 mg | ORAL_TABLET | Freq: Every day | ORAL | Status: DC

## 2013-09-18 NOTE — Patient Instructions (Signed)
   Begin Lisinopril 5mg  daily - new sent to pharm Continue all other medications.   Lab for BMET - due in 2 weeks, around 10/02/2013 Office will contact with results via phone or letter.   Your physician has requested that you regularly monitor and record your blood pressure readings at home. Please take at varied times of the day & return to office for MD review. Follow up in  3 months

## 2013-09-18 NOTE — Progress Notes (Signed)
Clinical Summary Greg Gates is a 58 y.o.male, this is our first visit together, he is seen for the following medical problems.   1. Chest pain - previous admit 07/2012 with chest pain, there was no evidence of ACS by EKG or enzymes - 07/2012 Lexiscan MPI without ischemia, LVEF 58% - echo 12/2010 LVEF 65-70%  - denies any recent chest pain. Can have some mild DOE with walking up inclines. Walks to work 20 min each way daily without significant symptoms.   2. HTN - does not check bp regularly at home - compliant with meds  3. Borderline Diabetes - followed by Dr Manuella Ghazi, Hgb A1c 07/2012 was 6.4  4. CKD - last Cr 1.49 07/2012 - follwed by Dr Manuella Ghazi   Past Medical History  Diagnosis Date  . HTN (hypertension)   . Hyperlipidemia   . GERD (gastroesophageal reflux disease)   . Sleep apnea     No formal diagnosis  . Obesity (BMI 35.0-39.9 without comorbidity)      No Known Allergies   Current Outpatient Prescriptions  Medication Sig Dispense Refill  . amLODipine (NORVASC) 5 MG tablet Take 1 tablet (5 mg total) by mouth daily.  30 tablet  1  . aspirin EC 81 MG EC tablet Take 1 tablet (81 mg total) by mouth daily.      Marland Kitchen atenolol (TENORMIN) 50 MG tablet Take 1 tablet (50 mg total) by mouth daily.  30 tablet  11  . indomethacin (INDOCIN) 25 MG capsule Take 25 mg by mouth 2 (two) times daily as needed (gout flare).       Marland Kitchen omeprazole (PRILOSEC) 20 MG capsule Take 20 mg by mouth daily as needed (acid reflux).       . triamterene-hydrochlorothiazide (MAXZIDE-25) 37.5-25 MG per tablet Take 1 tablet by mouth daily.        No current facility-administered medications for this visit.     Past Surgical History  Procedure Laterality Date  . Colonoscopy  01/09/2011    RXV:[id number] hemorrhoids/multiple sessile polyp in the rectum and sigmoid colon  . Polypectomy  02/13/2011    PJK:DTOIZ polyps (hyperplastic) in the rectum/internal hemorrhoids/diverticulosis. Next TCS 02/2021 with 2 day  clear liquids/overtube, propofol.      No Known Allergies    Family History  Problem Relation Age of Onset  . Diabetes Father   . Peripheral vascular disease Father   . Prostate cancer Father     uncle & cousin  . Anesthesia problems Neg Hx   . Hypotension Neg Hx   . Malignant hyperthermia Neg Hx   . Pseudochol deficiency Neg Hx   . Diabetes Mother      Social History Greg Gates reports that he quit smoking about 12 years ago. His smoking use included Cigarettes. He has a 25 pack-year smoking history. He does not have any smokeless tobacco history on file. Greg Gates reports that he drinks alcohol.   Review of Systems CONSTITUTIONAL: No weight loss, fever, chills, weakness or fatigue.  HEENT: Eyes: No visual loss, blurred vision, double vision or yellow sclerae.No hearing loss, sneezing, congestion, runny nose or sore throat.  SKIN: No rash or itching.  CARDIOVASCULAR: per HPI RESPIRATORY: No shortness of breath, cough or sputum.  GASTROINTESTINAL: No anorexia, nausea, vomiting or diarrhea. No abdominal pain or blood.  GENITOURINARY: No burning on urination, no polyuria NEUROLOGICAL: No headache, dizziness, syncope, paralysis, ataxia, numbness or tingling in the extremities. No change in bowel or bladder control.  MUSCULOSKELETAL:  No muscle, back pain, joint pain or stiffness.  LYMPHATICS: No enlarged nodes. No history of splenectomy.  PSYCHIATRIC: No history of depression or anxiety.  ENDOCRINOLOGIC: No reports of sweating, cold or heat intolerance. No polyuria or polydipsia.  Marland Kitchen   Physical Examination p 61 bp 135/80 Wt 312 lbs BMI 39 Gen: resting comfortably, no acute distress HEENT: no scleral icterus, pupils equal round and reactive, no palptable cervical adenopathy,  CV: RRR, no m/r/g, no JVD, no carotid bruits Resp: Clear to auscultation bilaterally GI: abdomen is soft, non-tender, non-distended, normal bowel sounds, no hepatosplenomegaly MSK: extremities are  warm, no edema.  Skin: warm, no rash Neuro:  no focal deficits Psych: appropriate affect   Diagnostic Studies 07/2012 Lexiscan MPI Findings: Spect: No definite inducible or reversible ischemia with pharmacologic stress. No fixed defects. Grossly normal wall motion Calculated Q G S ejection fraction is 58%.  IMPRESSION: Negative for inducible ischemia with pharmacologic stress.   12/2010 Echo Study Conclusions  - Left ventricle: The cavity size was normal. There was moderate concentric hypertrophy. Systolic function was vigorous. The estimated ejection fraction was in the range of 65% to 70%. Wall motion was normal; there were no regional wall motion abnormalities. - Aortic valve: Mildly to moderately calcified annulus. Trivial regurgitation. - Left atrium: The atrium was mildly dilated. - Atrial septum: No defect or patent foramen ovale was identified.   09/18/13 Clinic EKG NSR  Assessment and Plan  1. Chest pain - no recent symptoms - negative MPI a year ago, echo with normal LV function - continue risk factor modification  2. HTN - given his underlying renal dysfunction he is not at goal (goal < 130/80) - will start ACE-I given his prediabetes and renal dysfunction, check BMET in 2 weeks. Start lisinopril 5mg  daily  3. Borderline diabetes - per pcp  4. CKD - per pcp - starting ACE-I as described above  F/u 3 months    Arnoldo Lenis, M.D., F.A.C.C.

## 2013-10-22 ENCOUNTER — Other Ambulatory Visit: Payer: Self-pay | Admitting: *Deleted

## 2013-10-23 ENCOUNTER — Telehealth: Payer: Self-pay | Admitting: *Deleted

## 2013-10-23 NOTE — Telephone Encounter (Signed)
Received refill from [facility] for Atenolol 50mg  daily.  Call placed to patient to confirm dose as it was documented daily in one area & 1/2 tab daily in another section.  Patient stated that [facility] years ago.  States he has always done the 1/2 tab.  Stated he did begin the Lisinopril as directed at last OV.  Please confirm what dose he should be on.

## 2013-10-23 NOTE — Telephone Encounter (Signed)
I only have what are chart documents, atenolol 50mg  daily. I would confirm the pill strength and dosing instructions the pharmacy has had for him and refill that   Zandra Abts MD

## 2013-10-23 NOTE — Telephone Encounter (Signed)
Notes Recorded by Laurine Blazer, LPN on 1/60/1093 at 2:35 PM Patient notified. States Dr. Manuella Ghazi has already started him on medication.

## 2013-10-23 NOTE — Telephone Encounter (Signed)
Message copied by Laurine Blazer on Thu Oct 23, 2013  3:38 PM ------      Message from: Greg Gates      Created: Mon Oct 20, 2013 10:38 AM       Patient has elevated choletsterol, recommend starting lipitor 40mg  daily if he is willing to start.            Zandra Abts MD ------

## 2013-10-24 MED ORDER — ATENOLOL 25 MG PO TABS: 25.0000 mg | ORAL_TABLET | Freq: Every day | ORAL | Status: DC

## 2013-10-24 NOTE — Telephone Encounter (Signed)
Discussed with patient & has always taken this way.  Has been getting 30 tabs at a time instead of 15 since he was doing 1/2 of the 50mg  tablet which was lasting him longer.  Will send in new prescription for 25mg  daily so he will no longer have to break in half.  Patient in agreement with plan.

## 2013-10-24 NOTE — Telephone Encounter (Signed)
Left message to return call 

## 2013-12-19 ENCOUNTER — Ambulatory Visit: Payer: BC Managed Care – PPO | Admitting: Cardiology

## 2014-01-02 ENCOUNTER — Ambulatory Visit: Payer: BC Managed Care – PPO | Admitting: Cardiology

## 2014-01-09 ENCOUNTER — Encounter: Payer: Self-pay | Admitting: Cardiology

## 2014-01-09 ENCOUNTER — Ambulatory Visit (INDEPENDENT_AMBULATORY_CARE_PROVIDER_SITE_OTHER): Payer: BC Managed Care – PPO | Admitting: Cardiology

## 2014-01-09 VITALS — BP 118/80 | HR 69 | Ht 74.0 in | Wt 320.4 lb

## 2014-01-09 DIAGNOSIS — E785 Hyperlipidemia, unspecified: Secondary | ICD-10-CM

## 2014-01-09 DIAGNOSIS — R0789 Other chest pain: Secondary | ICD-10-CM

## 2014-01-09 DIAGNOSIS — I10 Essential (primary) hypertension: Secondary | ICD-10-CM

## 2014-01-09 NOTE — Patient Instructions (Signed)
Continue all current medications. Your physician wants you to follow up in:  1 year.  You will receive a reminder letter in the mail one-two months in advance.  If you don't receive a letter, please call our office to schedule the follow up appointment   

## 2014-01-09 NOTE — Progress Notes (Signed)
Clinical Summary Greg Gates is a 58 y.o.male seen today for follow up of the following medical problems.   1. Chest pain  - previous admit 07/2012 with chest pain, there was no evidence of ACS by EKG or enzymes  - 07/2012 Lexiscan MPI without ischemia, LVEF 58%  - echo 12/2010 LVEF 65-70%  - recent visit to [facility] with chest pain, discharged after 1 day. Those records aren't available at this time. No recurrent symptoms.   2. HTN - does not check bp regularly at home  - compliant with meds  3. Borderline Diabetes  - followed by Dr Manuella Ghazi, Hgb A1c 07/2012 was 6.4   4. CKD  - last Cr 1.49 07/2012  - follwed by Dr Manuella Ghazi   5. Hyperlipidemia - panel 10/2013: TC 257 TG 128 HDL 35 LDL196 - recently started on statin, after the most recent panel - followed by pcp  Past Medical History  Diagnosis Date  . HTN (hypertension)   . Hyperlipidemia   . GERD (gastroesophageal reflux disease)   . Sleep apnea     No formal diagnosis  . Obesity (BMI 35.0-39.9 without comorbidity)      No Known Allergies   Current Outpatient Prescriptions  Medication Sig Dispense Refill  . allopurinol (ZYLOPRIM) 100 MG tablet Take 100 mg by mouth daily.      Marland Kitchen amLODipine (NORVASC) 5 MG tablet Take 1 tablet (5 mg total) by mouth daily.  30 tablet  1  . aspirin EC 81 MG EC tablet Take 1 tablet (81 mg total) by mouth daily.      Marland Kitchen atenolol (TENORMIN) 25 MG tablet Take 1 tablet (25 mg total) by mouth daily.  30 tablet  6  . chlorthalidone (HYGROTON) 25 MG tablet Take 25 mg by mouth daily.      . indomethacin (INDOCIN) 25 MG capsule Take 25 mg by mouth 2 (two) times daily as needed (gout flare).       Marland Kitchen lisinopril (PRINIVIL,ZESTRIL) 5 MG tablet Take 1 tablet (5 mg total) by mouth daily.  30 tablet  6  . omeprazole (PRILOSEC) 20 MG capsule Take 20 mg by mouth daily as needed (acid reflux).        No current facility-administered medications for this visit.     Past Surgical History  Procedure Laterality  Date  . Colonoscopy  01/09/2011    VFI:EPPIRJJO hemorrhoids/multiple sessile polyp in the rectum and sigmoid colon  . Polypectomy  02/13/2011    ACZ:YSAYT polyps (hyperplastic) in the rectum/internal hemorrhoids/diverticulosis. Next TCS 02/2021 with 2 day clear liquids/overtube, propofol.      No Known Allergies    Family History  Problem Relation Age of Onset  . Diabetes Father   . Peripheral vascular disease Father   . Prostate cancer Father     uncle & cousin  . Anesthesia problems Neg Hx   . Hypotension Neg Hx   . Malignant hyperthermia Neg Hx   . Pseudochol deficiency Neg Hx   . Diabetes Mother      Social History Mr. Filippi reports that he quit smoking about 13 years ago. His smoking use included Cigarettes. He has a 25 pack-year smoking history. He has never used smokeless tobacco. Mr. Elpers reports that he drinks alcohol.   Review of Systems CONSTITUTIONAL: No weight loss, fever, chills, weakness or fatigue.  HEENT: Eyes: No visual loss, blurred vision, double vision or yellow sclerae.No hearing loss, sneezing, congestion, runny nose or sore throat.  SKIN:  No rash or itching.  CARDIOVASCULAR: per HPI RESPIRATORY: No shortness of breath, cough or sputum.  GASTROINTESTINAL: No anorexia, nausea, vomiting or diarrhea. No abdominal pain or blood.  GENITOURINARY: No burning on urination, no polyuria NEUROLOGICAL: No headache, dizziness, syncope, paralysis, ataxia, numbness or tingling in the extremities. No change in bowel or bladder control.  MUSCULOSKELETAL: No muscle, back pain, joint pain or stiffness.  LYMPHATICS: No enlarged nodes. No history of splenectomy.  PSYCHIATRIC: No history of depression or anxiety.  ENDOCRINOLOGIC: No reports of sweating, cold or heat intolerance. No polyuria or polydipsia.  Marland Kitchen   Physical Examination p 69 bp 118/80 Wt 320 lbs BMI 41 Gen: resting comfortably, no acute distress HEENT: no scleral icterus, pupils equal round and reactive,  no palptable cervical adenopathy,  CV: RRR, no m/r/g, no JVD, no carotid bruits Resp: Clear to auscultation bilaterally GI: abdomen is soft, non-tender, non-distended, normal bowel sounds, no hepatosplenomegaly MSK: extremities are warm, no edema.  Skin: warm, no rash Neuro:  no focal deficits Psych: appropriate affect   Diagnostic Studies 07/2012 Lexiscan MPI  Findings: Spect: No definite inducible or reversible ischemia with pharmacologic stress. No fixed defects. Grossly normal wall motion Calculated Q G S ejection fraction is 58%.  IMPRESSION: Negative for inducible ischemia with pharmacologic stress.  12/2010 Echo  Study Conclusions  - Left ventricle: The cavity size was normal. There was moderate concentric hypertrophy. Systolic function was vigorous. The estimated ejection fraction was in the range of 65% to 70%. Wall motion was normal; there were no regional wall motion abnormalities. - Aortic valve: Mildly to moderately calcified annulus. Trivial regurgitation. - Left atrium: The atrium was mildly dilated. - Atrial septum: No defect or patent foramen ovale was identified.  09/18/13 Clinic EKG  NSR     Assessment and Plan  1. Chest pain  - history of atypical chest pain - negative MPI a year ago, echo with normal LV function  - recent admit with symptoms to [facility], ruled out for ischemia and discharged. No recurrent symptoms since that time.  - continue risk factor modification   2. HTN  - at goal, given his CKD goal BP <130/80  3. Borderline diabetes  - per pcp   4. CKD  - per pcp   5. Hyperlipidemia - continue statin, repeat panel per pcp   F/u 1 year       Arnoldo Lenis, M.D., F.A.C.C.

## 2016-01-27 ENCOUNTER — Institutional Professional Consult (permissible substitution): Payer: Self-pay | Admitting: Internal Medicine

## 2016-02-18 ENCOUNTER — Ambulatory Visit (INDEPENDENT_AMBULATORY_CARE_PROVIDER_SITE_OTHER): Payer: BLUE CROSS/BLUE SHIELD | Admitting: Internal Medicine

## 2016-02-18 ENCOUNTER — Encounter: Payer: Self-pay | Admitting: Internal Medicine

## 2016-02-18 VITALS — BP 170/102 | HR 74 | Ht 75.0 in | Wt 338.2 lb

## 2016-02-18 DIAGNOSIS — R058 Other specified cough: Secondary | ICD-10-CM | POA: Insufficient documentation

## 2016-02-18 DIAGNOSIS — R05 Cough: Secondary | ICD-10-CM | POA: Insufficient documentation

## 2016-02-18 DIAGNOSIS — I10 Essential (primary) hypertension: Secondary | ICD-10-CM | POA: Diagnosis not present

## 2016-02-18 MED ORDER — FAMOTIDINE 20 MG PO TABS: ORAL_TABLET | ORAL | 2 refills | Status: DC

## 2016-02-18 MED ORDER — PREDNISONE 10 MG PO TABS: ORAL_TABLET | ORAL | 0 refills | Status: DC

## 2016-02-18 MED ORDER — TRAMADOL HCL 50 MG PO TABS: ORAL_TABLET | ORAL | 0 refills | Status: DC

## 2016-02-18 MED ORDER — PANTOPRAZOLE SODIUM 40 MG PO TBEC: 40.0000 mg | DELAYED_RELEASE_TABLET | Freq: Every day | ORAL | 2 refills | Status: DC

## 2016-02-18 NOTE — Assessment & Plan Note (Signed)
Body mass index is 42.27 kg/m.  Lab Results  Component Value Date   TSH 3.786 08/09/2012     Contributing to gerd tendency/ doe/reviewed the need and the process to achieve and maintain neg calorie balance > defer f/u primary care including intermittently monitoring thyroid status

## 2016-02-18 NOTE — Assessment & Plan Note (Signed)
Not Adequate control on present rx, reviewed > no change in rx needed  For now but avoid Na > Follow up per Primary Care planned

## 2016-02-18 NOTE — Assessment & Plan Note (Signed)
Onset 6//2017 >>> sinus CT 02/05/16 neg a/f levels Cyclical cough rx [id number]    The most common causes of chronic cough in immunocompetent adults include the following: upper airway cough syndrome (UACS), previously referred to as postnasal drip syndrome (PNDS), which is caused by variety of rhinosinus conditions; (2) asthma; (3) GERD; (4) chronic bronchitis from cigarette smoking or other inhaled environmental irritants; (5) nonasthmatic eosinophilic bronchitis; and (6) bronchiectasis.   These conditions, singly or in combination, have accounted for up to 94% of the causes of chronic cough in prospective studies.   Other conditions have constituted no >6% of the causes in prospective studies These have included bronchogenic carcinoma, chronic interstitial pneumonia, sarcoidosis, left ventricular failure, ACEI-induced cough, and aspiration from a condition associated with pharyngeal dysfunction.    Chronic cough is often simultaneously caused by more than one condition. A single cause has been found from 38 to 82% of the time, multiple causes from 18 to 62%. Multiply caused cough has been the result of three diseases up to 42% of the time.       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   These are the same pts (now being labeled as having "irritable larynx syndrome" by some cough centers) who not infrequently have a history of having failed to tolerate ace inhibitors,  dry powder inhalers or biphosphonates or report having atypical reflux  symptoms that don't respond to standard doses of PPI , and are easily confused as having aecopd or asthma flares by even experienced allergists/ pulmonologists.   The first step is to maximize acid suppression and eliminate cyclical coughing then regroup if the cough persists.  I had an extended discussion with the patient reviewing all relevant studies completed to date and  lasting 35 min/ 60 min ov   1) Explained: The standardized cough guidelines published in Chest by Lissa Morales in 2006 are still the best available and consist of a multiple step process (up to 12!) , not a single office visit,  and are intended  to address this problem logically,  with an alogrithm dependent on response to empiric treatment at  each progressive step  to determine a specific diagnosis with  minimal addtional testing needed. Therefore if adherence is an issue or can't be accurately verified,  it's very unlikely the standard evaluation and treatment will be successful here.    Furthermore, response to therapy (other than acute cough suppression, which should only be used short term with avoidance of narcotic containing cough syrups if possible), can be a gradual process for which the patient may perceive immediate benefit.  Unlike going to an eye doctor where the best perscription is almost always the first one and is immediately effective, this is almost never the case in the management of chronic cough syndromes. Therefore the patient needs to commit up front to consistently adhere to recommendations  for up to 6 weeks of therapy directed at the likely underlying problem(s) before the response can be reasonably evaluated.  2) Each maintenance medication was reviewed in detail including most importantly the difference between maintenance and prns and under what circumstances the prns are to be triggered using an action plan format that is not reflected in the computer generated alphabetically organized AVS.     Please see instructions for details which were reviewed in writing and the patient given a copy highlighting the part that I personally wrote and discussed at today's ov.   See instructions for specific recommendations which were reviewed directly with the patient who was given a copy with highlighter outlining the key components.

## 2016-02-18 NOTE — Patient Instructions (Addendum)
The key to effective treatment for your cough is eliminating the non-stop cycle of cough you're stuck in long enough to let your airway heal completely and then see if there is anything still making you cough once you stop the cough suppression, but this should take no more than 5 days to figure out  First take delsym two tsp every 12 hours and supplement if needed with  tramadol 50 mg up to 2 every 4 hours to suppress the urge to cough at all or even clear your throat. Swallowing water or using ice chips/non mint and menthol containing candies (such as lifesavers or sugarless jolly ranchers) are also effective.  You should rest your voice and avoid activities that you know make you cough.  Once you have eliminated the cough for 3 straight days try reducing the tramadol first,  then the delsym as tolerated.    Prednisone 10 mg take  4 each am x 2 days,   2 each am x 2 days,  1 each am x 2 days and stop (this is to eliminate allergies and inflammation from coughing)  Protonix (pantoprazole) Take 30-60 min before first meal of the day and Pepcid 20 mg one bedtime plus chlorpheniramine 4 mg x 2 at bedtime (both available over the counter)  until cough is completely gone for at least a week without the need for cough suppression  GERD (REFLUX)  is an extremely common cause of respiratory symptoms, many times with no significant heartburn at all.    It can be treated with medication, but also with lifestyle changes including avoidance of late meals, excessive alcohol, smoking cessation, and avoid fatty foods, chocolate, peppermint, colas, red wine, and acidic juices such as orange juice.  NO MINT OR MENTHOL PRODUCTS SO NO COUGH DROPS  USE HARD CANDY INSTEAD (jolley ranchers or Stover's or Lifesavers (all available in sugarless versions) NO OIL BASED VITAMINS - use powdered substitutes.  If not all better in 2 weeks call me, if all better tell your friends

## 2016-02-18 NOTE — Progress Notes (Signed)
Subjective:    Patient ID: Greg Belfast., male    DOB: 08-24-1955,    MRN: [id number]  HPI  5 yobm  Quit smoking 2002 very athletic no chronic problems with pattern of recurrent cough x around 2007 when moved to [place] from [place]  typical pattern can last up to a month up several times a year s needing any ov's or prescriptions  with with typical recurrence early summer 2017 per persisted ever since  referred to pulmonary clinic 02/18/2016 by Dr  Manuella Ghazi.   02/18/2016 1st [facility] office visit/ Greg Gates   Chief Complaint  Patient presents with  . Pulmonary Consult    Referred by Dr. Manuella Ghazi. Pt c/o cough for the past 4 months. The cough is occ prod with clear sputum. He coughs until his stomach feels sore. Cough gets worse when he gets too hot or when he talks alot.   happened abruptly early summer 10/2015 then sob while on bevespi > ER with abd pain assumed to be from coughing> Prednisone rx/ Better sleeping than daytime, changes in temp / speaking aggravates it/ most of his mucus is in am and clear < 1-2 tbsp    No obvious day to day or daytime variability or assoc  purulent sputum or mucus plugs or hemoptysis or cp or chest tightness, subjective wheeze or overt sinus or hb symptoms. No unusual exp hx or h/o childhood pna/ asthma or knowledge of premature birth.  Sleeping ok without nocturnal  or early am exacerbation  of respiratory  c/o's or need for noct saba. Also denies any obvious fluctuation of symptoms with weather or environmental changes or other aggravating or alleviating factors except as outlined above   Current Medications, Allergies, Complete Past Medical History, Past Surgical History, Family History, and Social History were reviewed in [employer] record.     Review of Systems  Constitutional: Negative for activity change, appetite change, chills, fever and unexpected weight change.  HENT: Positive for congestion. Negative for dental problem,  postnasal drip, rhinorrhea, sneezing, sore throat, trouble swallowing and voice change.   Eyes: Negative for visual disturbance.  Respiratory: Positive for cough and shortness of breath. Negative for choking.   Cardiovascular: Negative for chest pain and leg swelling.  Gastrointestinal: Negative for abdominal pain, nausea and vomiting.  Genitourinary: Negative for difficulty urinating.  Musculoskeletal: Negative for arthralgias.  Skin: Negative for rash.  Psychiatric/Behavioral: Negative for behavioral problems and confusion.       Objective:   Physical Exam  Obese amb wm with very gruff voice  Wt Readings from Last 3 Encounters:  02/18/16 (!) 338 lb 3.2 oz (153.4 kg)  01/09/14 (!) 320 lb 6.4 oz (145.3 kg)  09/18/13 (!) 312 lb (141.5 kg)    Vital signs reviewed/ note elevated bp    HEENT: nl dentition, turbinates, and oropharynx. Nl external ear canals without cough reflex   NECK :  without JVD/Nodes/TM/ nl carotid upstrokes bilaterally   LUNGS: no acc muscle use,  Nl contour chest which is clear to A and P bilaterally without cough on insp or exp maneuvers   CV:  RRR  no s3 or murmur or increase in P2, no edema   ABD:  soft and nontender with nl inspiratory excursion in the supine position. No bruits or organomegaly, bowel sounds nl  MS:  Nl gait/ ext warm without deformities, calf tenderness, cyanosis or clubbing No obvious joint restrictions   SKIN: warm and dry without lesions  NEURO:  alert, approp, nl sensorium with  no motor deficits       I personally reviewed images and agree with radiology impression as follows:  CXR:   12/10/15 Chronic bronchitic changes      Assessment & Plan:

## 2016-03-02 ENCOUNTER — Ambulatory Visit (INDEPENDENT_AMBULATORY_CARE_PROVIDER_SITE_OTHER): Payer: BLUE CROSS/BLUE SHIELD | Admitting: Otolaryngology

## 2016-03-02 DIAGNOSIS — J330 Polyp of nasal cavity: Secondary | ICD-10-CM

## 2016-03-02 DIAGNOSIS — J343 Hypertrophy of nasal turbinates: Secondary | ICD-10-CM

## 2016-03-02 DIAGNOSIS — J342 Deviated nasal septum: Secondary | ICD-10-CM

## 2016-03-13 ENCOUNTER — Encounter: Payer: Self-pay | Admitting: Internal Medicine

## 2016-03-20 ENCOUNTER — Other Ambulatory Visit: Payer: Self-pay | Admitting: Internal Medicine

## 2016-03-20 DIAGNOSIS — R05 Cough: Secondary | ICD-10-CM

## 2016-03-20 DIAGNOSIS — R058 Other specified cough: Secondary | ICD-10-CM

## 2016-03-20 MED ORDER — FAMOTIDINE 20 MG PO TABS: ORAL_TABLET | ORAL | 0 refills | Status: DC

## 2016-04-10 ENCOUNTER — Other Ambulatory Visit: Payer: Self-pay | Admitting: Internal Medicine

## 2016-04-10 DIAGNOSIS — R058 Other specified cough: Secondary | ICD-10-CM

## 2016-04-10 DIAGNOSIS — R05 Cough: Secondary | ICD-10-CM

## 2016-04-10 MED ORDER — FAMOTIDINE 20 MG PO TABS: ORAL_TABLET | ORAL | 0 refills | Status: DC

## 2016-04-10 MED ORDER — PANTOPRAZOLE SODIUM 40 MG PO TBEC: 40.0000 mg | DELAYED_RELEASE_TABLET | Freq: Every day | ORAL | 0 refills | Status: DC

## 2016-06-05 ENCOUNTER — Ambulatory Visit (INDEPENDENT_AMBULATORY_CARE_PROVIDER_SITE_OTHER): Payer: BLUE CROSS/BLUE SHIELD | Admitting: Otolaryngology

## 2016-06-05 DIAGNOSIS — J310 Chronic rhinitis: Secondary | ICD-10-CM | POA: Diagnosis not present

## 2016-06-05 DIAGNOSIS — J342 Deviated nasal septum: Secondary | ICD-10-CM

## 2016-06-05 DIAGNOSIS — J343 Hypertrophy of nasal turbinates: Secondary | ICD-10-CM

## 2016-07-26 ENCOUNTER — Other Ambulatory Visit: Payer: Self-pay | Admitting: Internal Medicine

## 2016-07-26 DIAGNOSIS — R058 Other specified cough: Secondary | ICD-10-CM

## 2016-07-26 DIAGNOSIS — R05 Cough: Secondary | ICD-10-CM

## 2016-08-19 ENCOUNTER — Other Ambulatory Visit: Payer: Self-pay | Admitting: Internal Medicine

## 2016-08-19 DIAGNOSIS — R058 Other specified cough: Secondary | ICD-10-CM

## 2016-08-19 DIAGNOSIS — R05 Cough: Secondary | ICD-10-CM

## 2016-11-27 ENCOUNTER — Other Ambulatory Visit: Payer: Self-pay | Admitting: Internal Medicine

## 2016-11-27 ENCOUNTER — Ambulatory Visit
Admission: RE | Admit: 2016-11-27 | Discharge: 2016-11-27 | Disposition: A | Discharge status: Home / Self Care | Payer: Self-pay | Source: Ambulatory Visit | Attending: Internal Medicine | Admitting: Internal Medicine

## 2016-11-27 DIAGNOSIS — R05 Cough: Secondary | ICD-10-CM

## 2016-11-27 DIAGNOSIS — R058 Other specified cough: Secondary | ICD-10-CM

## 2017-02-03 ENCOUNTER — Other Ambulatory Visit: Payer: Self-pay | Admitting: Internal Medicine

## 2017-02-03 DIAGNOSIS — R05 Cough: Secondary | ICD-10-CM

## 2017-02-03 DIAGNOSIS — R058 Other specified cough: Secondary | ICD-10-CM

## 2017-02-08 ENCOUNTER — Encounter: Payer: Self-pay | Admitting: Neurology

## 2017-05-14 ENCOUNTER — Other Ambulatory Visit: Payer: Self-pay | Admitting: Internal Medicine

## 2017-05-14 DIAGNOSIS — R05 Cough: Secondary | ICD-10-CM

## 2017-05-14 DIAGNOSIS — R058 Other specified cough: Secondary | ICD-10-CM

## 2017-05-22 ENCOUNTER — Ambulatory Visit: Payer: BLUE CROSS/BLUE SHIELD | Admitting: Neurology

## 2017-05-22 ENCOUNTER — Encounter: Payer: Self-pay | Admitting: Neurology

## 2017-05-22 VITALS — BP 150/96 | HR 70 | Ht 75.0 in | Wt 323.6 lb

## 2017-05-22 DIAGNOSIS — I10 Essential (primary) hypertension: Secondary | ICD-10-CM | POA: Diagnosis not present

## 2017-05-22 DIAGNOSIS — E785 Hyperlipidemia, unspecified: Secondary | ICD-10-CM

## 2017-05-22 DIAGNOSIS — I6330 Cerebral infarction due to thrombosis of unspecified cerebral artery: Secondary | ICD-10-CM

## 2017-05-22 MED ORDER — CLOPIDOGREL BISULFATE 75 MG PO TABS: 75.0000 mg | ORAL_TABLET | Freq: Every day | ORAL | 2 refills | Status: DC

## 2017-05-22 NOTE — Progress Notes (Signed)
NEUROLOGY CONSULTATION NOTE  Greg Gates. MRN: [id number] DOB: Sep 12, 1955  Referring provider: Dr. Manuella Ghazi Primary care provider: Dr. Manuella Ghazi  Reason for consult:  TIA  HISTORY OF PRESENT ILLNESS: Greg Gates is a 62 year old right-handed male with hypetension, hypercholesterolemia, CKD stage III, coronary atherosclerosis and former smoker who presents for TIA.  History supplemented by PCP note.  On 02/06/17, he presented to the ED with sudden onset slurred speech and diaphoresis.  There was no associated facial droop, unilateral numbness or weakness, dizziness or gait instability.  It  lasted 20 minutes.  MRI of head demonstrated acute lacunar infarct in the right posterior putamen/posterior limb of internal capsule (as per report, imaging not available).  His PCP changed him from ASA 81mg  to Plavix 75mg .  However, he has since switched back to ASA because he ran out of refills.  He has not had any recurrent symptoms.  Current medication:  ASA 81mg , Lipitor 20mg , amlodipine, labetolol, clonidine  PAST MEDICAL HISTORY: Past Medical History:  Diagnosis Date  . GERD (gastroesophageal reflux disease)   . HTN (hypertension)   . Hyperlipidemia   . Obesity (BMI 35.0-39.9 without comorbidity)   . Sleep apnea    No formal diagnosis    PAST SURGICAL HISTORY: Past Surgical History:  Procedure Laterality Date  . COLONOSCOPY  01/09/2011   QHU:[id number] hemorrhoids/multiple sessile polyp in the rectum and sigmoid colon  . POLYPECTOMY  02/13/2011   TWS:FKCLE polyps (hyperplastic) in the rectum/internal hemorrhoids/diverticulosis. Next TCS 02/2021 with 2 day clear liquids/overtube, propofol.     MEDICATIONS: Current Outpatient Medications on File Prior to Visit  Medication Sig Dispense Refill  . labetalol (NORMODYNE) 100 MG tablet Take 100 mg by mouth 2 (two) times daily.    Marland Kitchen labetalol (NORMODYNE) 200 MG tablet Take 200 mg by mouth 2 (two) times daily.    Marland Kitchen allopurinol (ZYLOPRIM)  100 MG tablet Take 100 mg by mouth daily.    Marland Kitchen amLODipine (NORVASC) 5 MG tablet Take 1 tablet (5 mg total) by mouth daily. 30 tablet 1  . aspirin EC 81 MG EC tablet Take 1 tablet (81 mg total) by mouth daily.    Marland Kitchen atorvastatin (LIPITOR) 10 MG tablet Take 10 mg by mouth daily.    . cloNIDine (CATAPRES) 0.3 MG tablet Take 0.3 mg by mouth 2 (two) times daily.    . fluticasone (FLONASE) 50 MCG/ACT nasal spray Place 2 sprays into both nostrils daily.    . pantoprazole (PROTONIX) 40 MG tablet TAKE 1 TABLET (40 MG TOTAL) BY MOUTH DAILY. TAKE 30-60 MIN BEFORE FIRST MEAL OF THE DAY 90 tablet 1  . predniSONE (DELTASONE) 10 MG tablet Take  4 each am x 2 days,   2 each am x 2 days,  1 each am x 2 days and stop (Patient not taking: Reported on 05/22/2017) 14 tablet 0  . sodium chloride (OCEAN) 0.65 % SOLN nasal spray Place 1 spray into both nostrils as needed for congestion.    . traMADol (ULTRAM) 50 MG tablet 1-2 every 4 hours as needed for cough or pain 40 tablet 0   No current facility-administered medications on file prior to visit.     ALLERGIES: No Known Allergies  FAMILY HISTORY: Family History  Problem Relation Age of Onset  . Diabetes Father   . Peripheral vascular disease Father   . Prostate cancer Father        uncle & cousin  . High blood pressure Father   .  Diabetes Mother   . Diabetes Sister   . Anesthesia problems Neg Hx   . Hypotension Neg Hx   . Malignant hyperthermia Neg Hx   . Pseudochol deficiency Neg Hx     SOCIAL HISTORY: Social History   Socioeconomic History  . Marital status: Single    Spouse name: Not on file  . Number of children: 1  . Years of education: Not on file  . Highest education level: High school graduate  Social Needs  . Financial resource strain: Not on file  . Food insecurity - worry: Not on file  . Food insecurity - inability: Not on file  . Transportation needs - medical: Not on file  . Transportation needs - non-medical: Not on file    Occupational History  . Occupation: Corporate investment banker: [employer]  Tobacco Use  . Smoking status: Former Smoker    Packs/day: 1.00    Years: 25.00    Pack years: 25.00    Types: Cigarettes    Last attempt to quit: 12/20/2000    Years since quitting: 16.4  . Smokeless tobacco: Never Used  Substance and Sexual Activity  . Alcohol use: Yes    Comment: daily etoh until 2002, now rarely  . Drug use: Yes    Comment: quit 2002- acid, marijuana, cocaine, crack, hash   . Sexual activity: Yes    Birth control/protection: None  Other Topics Concern  . Not on file  Social History Narrative   1 Grown biological son & step-daughter   Lives alone   Lives in a one story home. Drinks caffeine only occasionally. Is active at work, though does not exercise.    REVIEW OF SYSTEMS: Constitutional: No fevers, chills, or sweats, no generalized fatigue, change in appetite Eyes: No visual changes, double vision, eye pain Ear, nose and throat: No hearing loss, ear pain, nasal congestion, sore throat Cardiovascular: No chest pain, palpitations Respiratory:  No shortness of breath at rest or with exertion, wheezes GastrointestinaI: No nausea, vomiting, diarrhea, abdominal pain, fecal incontinence Genitourinary:  No dysuria, urinary retention or frequency Musculoskeletal:  No neck pain, back pain Integumentary: No rash, pruritus, skin lesions Neurological: as above Psychiatric: No depression, insomnia, anxiety Endocrine: No palpitations, fatigue, diaphoresis, mood swings, change in appetite, change in weight, increased thirst Hematologic/Lymphatic:  No purpura, petechiae. Allergic/Immunologic: no itchy/runny eyes, nasal congestion, recent allergic reactions, rashes  PHYSICAL EXAM: Vitals:   05/22/17 0803  BP: (!) 150/96  Pulse: 70  SpO2: 96%   General: No acute distress.  Patient appears well-groomed.  Head:  Normocephalic/atraumatic Eyes:  fundi examined but not  visualized Neck: supple, no paraspinal tenderness, full range of motion Back: No paraspinal tenderness Heart: regular rate and rhythm Lungs: Clear to auscultation bilaterally. Vascular: No carotid bruits. Neurological Exam: Mental status: alert and oriented to person, place, and time, recent and remote memory intact, fund of knowledge intact, attention and concentration intact, speech fluent and not dysarthric, language intact. Cranial nerves: CN I: not tested CN II: pupils equal, round and reactive to light, visual fields intact CN III, IV, VI:  full range of motion, no nystagmus, no ptosis CN V: facial sensation intact CN VII: upper and lower face symmetric CN VIII: hearing intact CN IX, X: gag intact, uvula midline CN XI: sternocleidomastoid and trapezius muscles intact CN XII: tongue midline Bulk & Tone: normal, no fasciculations. Motor:  5/5 throughout  Sensation: temperature and vibration sensation intact. Deep Tendon Reflexes:  1+ upper extremities,  absent lower extremities, toes downgoing.  Finger to nose testing:  Without dysmetria.  Heel to shin:  Without dysmetria.  Gait:  Normal station and stride.  Able to turn and tandem walk. Romberg negative.  IMPRESSION: 1.  Lacunar infarct in right putamen/posterior limb or internal capsule.  His presenting symptoms were non-focal and do not necessarily correlate with the stroke.  However, an acute stroke was seen on MRI with his presenting symptoms. 2.  HTN, uncontrolled 3.  Hyperlipidemia 4.  Morbid obesity (BMI 40.45)  PLAN: 1.  Recommend switching ASA back to Plavix 75mg  daily for secondary stroke prevention (prescription sent to pharmacy). 2.  Continue Lipitor 20mg  daily.  He reportedly had lipid panel checked last month.  Will obtain labs and adjust dose accordingly (LDL goal less than 70) 3.  Optimize blood pressure with Dr. Manuella Ghazi 4.  Check carotid doppler and 2D echocardiogram 5.  Lifestyle modification:  Exercise,  Mediterranean diet, weight loss 6.  Follow up in 3 months.  Thank you for allowing me to take part in the care of this patient.  Metta Clines, DO  CC:  Monico Blitz, MD

## 2017-05-22 NOTE — Patient Instructions (Addendum)
1.  I want you to switch back from aspirin to Plavix 75mg  daily.  I will send a refill 2.  Continue atorvastatin 20mg  daily.  I will get your labs from last month and contact you if we need to increase dose. 3.  We will check carotid doppler and echocardiogram 4.  Try to work with Dr. Manuella Ghazi to improve blood pressure 5.  Recommend Mediterranean diet (see below).  Work on weight loss. 6.  Start routine exercise (cardiovascular exercise at least 30 minutes 4 days a week) 7.  Follow up in 3 months.   Mediterranean Diet A Mediterranean diet refers to food and lifestyle choices that are based on the traditions of countries located on the [employer]. This way of eating has been shown to help prevent certain conditions and improve outcomes for people who have chronic diseases, like kidney disease and heart disease. What are tips for following this plan? Lifestyle  Cook and eat meals together with your family, when possible.  Drink enough fluid to keep your urine clear or pale yellow.  Be physically active every day. This includes: ? Aerobic exercise like running or swimming. ? Leisure activities like gardening, walking, or housework.  Get 7-8 hours of sleep each night.  If recommended by your health care provider, drink red wine in moderation. This means 1 glass a day for nonpregnant women and 2 glasses a day for men. A glass of wine equals 5 oz (150 mL). Reading food labels  Check the serving size of packaged foods. For foods such as rice and pasta, the serving size refers to the amount of cooked product, not dry.  Check the total fat in packaged foods. Avoid foods that have saturated fat or trans fats.  Check the ingredients list for added sugars, such as corn syrup. Shopping  At the grocery store, buy most of your food from the areas near the walls of the store. This includes: ? Fresh fruits and vegetables (produce). ? Grains, beans, nuts, and seeds. Some of these may be  available in unpackaged forms or large amounts (in bulk). ? Fresh seafood. ? Poultry and eggs. ? Low-fat dairy products.  Buy whole ingredients instead of prepackaged foods.  Buy fresh fruits and vegetables in-season from local farmers markets.  Buy frozen fruits and vegetables in resealable bags.  If you do not have access to quality fresh seafood, buy precooked frozen shrimp or canned fish, such as tuna, salmon, or sardines.  Buy small amounts of raw or cooked vegetables, salads, or olives from the deli or salad bar at your store.  Stock your pantry so you always have certain foods on hand, such as olive oil, canned tuna, canned tomatoes, rice, pasta, and beans. Cooking  Cook foods with extra-virgin olive oil instead of using butter or other vegetable oils.  Have meat as a side dish, and have vegetables or grains as your main dish. This means having meat in small portions or adding small amounts of meat to foods like pasta or stew.  Use beans or vegetables instead of meat in common dishes like chili or lasagna.  Experiment with different cooking methods. Try roasting or broiling vegetables instead of steaming or sauteing them.  Add frozen vegetables to soups, stews, pasta, or rice.  Add nuts or seeds for added healthy fat at each meal. You can add these to yogurt, salads, or vegetable dishes.  Marinate fish or vegetables using olive oil, lemon juice, garlic, and fresh herbs. Meal planning  Plan to eat 1 vegetarian meal one day each week. Try to work up to 2 vegetarian meals, if possible.  Eat seafood 2 or more times a week.  Have healthy snacks readily available, such as: ? Vegetable sticks with hummus. ? [place] yogurt. ? Fruit and nut trail mix.  Eat balanced meals throughout the week. This includes: ? Fruit: 2-3 servings a day ? Vegetables: 4-5 servings a day ? Low-fat dairy: 2 servings a day ? Fish, poultry, or lean meat: 1 serving a day ? Beans and legumes: 2 or  more servings a week ? Nuts and seeds: 1-2 servings a day ? Whole grains: 6-8 servings a day ? Extra-virgin olive oil: 3-4 servings a day  Limit red meat and sweets to only a few servings a month What are my food choices?  Mediterranean diet ? Recommended ? Grains: Whole-grain pasta. Brown rice. Bulgar wheat. Polenta. Couscous. Whole-wheat bread. [place]. ? Vegetables: Artichokes. Beets. Broccoli. Cabbage. Carrots. Eggplant. Green beans. Chard. Kale. Spinach. Onions. Leeks. Peas. Squash. Tomatoes. Peppers. Radishes. ? Fruits: Apples. Apricots. Avocado. Berries. Bananas. Cherries. Dates. Figs. Grapes. Lemons. Melon. Oranges. Peaches. Plums. Pomegranate. ? Meats and other protein foods: Beans. Almonds. Sunflower seeds. Pine nuts. Peanuts. [place]. Salmon. Scallops. Shrimp. [place]. Tilapia. Clams. Oysters. Eggs. ? Dairy: Low-fat milk. Cheese. Greek yogurt. ? Beverages: Water. Red wine. Herbal tea. ? Fats and oils: Extra virgin olive oil. Avocado oil. Grape seed oil. ? Sweets and desserts: [place] yogurt with honey. Baked apples. Poached pears. Trail mix. ? Seasoning and other foods: Basil. Cilantro. Coriander. Cumin. Mint. Parsley. Sage. Rosemary. Tarragon. Garlic. Oregano. Thyme. Pepper. Balsalmic vinegar. Tahini. Hummus. Tomato sauce. Olives. Mushrooms. ? Limit these ? Grains: Prepackaged pasta or rice dishes. Prepackaged cereal with added sugar. ? Vegetables: Deep fried potatoes (french fries). ? Fruits: Fruit canned in syrup. ? Meats and other protein foods: Beef. Pork. Lamb. Poultry with skin. Hot dogs. Berniece Salines. ? Dairy: Ice cream. Sour cream. Whole milk. ? Beverages: Juice. Sugar-sweetened soft drinks. Beer. Liquor and spirits. ? Fats and oils: Butter. Canola oil. Vegetable oil. Beef fat (tallow). Lard. ? Sweets and desserts: Cookies. Cakes. Pies. Candy. ? Seasoning and other foods: Mayonnaise. Premade sauces and marinades. ? The items listed may not be a complete list. Talk with your  dietitian about what dietary choices are right for you. Summary  The Mediterranean diet includes both food and lifestyle choices.  Eat a variety of fresh fruits and vegetables, beans, nuts, seeds, and whole grains.  Limit the amount of red meat and sweets that you eat.  Talk with your health care provider about whether it is safe for you to drink red wine in moderation. This means 1 glass a day for nonpregnant women and 2 glasses a day for men. A glass of wine equals 5 oz (150 mL). This information is not intended to replace advice given to you by your health care provider. Make sure you discuss any questions you have with your health care provider. Document Released: 12/23/2015 Document Revised: 01/25/2016 Document Reviewed: 12/23/2015 Elsevier Interactive Patient Education  [employer].

## 2017-05-25 ENCOUNTER — Telehealth: Payer: Self-pay | Admitting: Neurology

## 2017-05-25 ENCOUNTER — Ambulatory Visit (HOSPITAL_COMMUNITY)
Admission: RE | Admit: 2017-05-25 | Discharge: 2017-05-25 | Disposition: A | Discharge status: Home / Self Care | Payer: BLUE CROSS/BLUE SHIELD | Source: Ambulatory Visit | Attending: Neurology | Admitting: Neurology

## 2017-05-25 DIAGNOSIS — I080 Rheumatic disorders of both mitral and aortic valves: Secondary | ICD-10-CM | POA: Insufficient documentation

## 2017-05-25 DIAGNOSIS — E785 Hyperlipidemia, unspecified: Secondary | ICD-10-CM | POA: Insufficient documentation

## 2017-05-25 DIAGNOSIS — I10 Essential (primary) hypertension: Secondary | ICD-10-CM | POA: Insufficient documentation

## 2017-05-25 DIAGNOSIS — I6330 Cerebral infarction due to thrombosis of unspecified cerebral artery: Secondary | ICD-10-CM | POA: Insufficient documentation

## 2017-05-25 LAB — ECHOCARDIOGRAM COMPLETE
AVLVOTPG: 6 mmHg
EERAT: 12.22
EWDT: 359 ms
FS: 41 % (ref 28–44)
IVS/LV PW RATIO, ED: 1.01
LA diam end sys: 34 mm
LA diam index: 1.2 cm/m2
LA vol: 105 mL
LASIZE: 34 mm
LAVOLA4C: 95.5 mL
LAVOLIN: 36.9 mL/m2
LDCA: 4.15 cm2
LV E/e'average: 12.22
LV PW d: 18.8 mm — AB (ref 0.6–1.1)
LV TDI E'MEDIAL: 4.9
LV dias vol index: 45 mL/m2
LV e' LATERAL: 6.09 cm/s
LVDIAVOL: 127 mL (ref 62–150)
LVEEMED: 12.22
LVOT SV: 109 mL
LVOT VTI: 26.2 cm
LVOT diameter: 23 mm
LVOT peak vel: 122 cm/s
Lateral S' vel: 16.6 cm/s
MV Dec: 359
MV pk E vel: 74.4 m/s
MVPG: 2 mmHg
MVPKAVEL: 81.3 m/s
RV sys press: 37 mmHg
Reg peak vel: 290 cm/s
TAPSE: 25.9 mm
TDI e' lateral: 6.09
TR max vel: 290 cm/s

## 2017-05-25 NOTE — Progress Notes (Signed)
*  PRELIMINARY RESULTS* Echocardiogram 2D Echocardiogram has been performed.  Greg Gates 05/25/2017, 4:00 PM

## 2017-05-25 NOTE — Telephone Encounter (Signed)
ROI faxed to [facility]

## 2017-05-30 ENCOUNTER — Telehealth: Payer: Self-pay

## 2017-05-30 NOTE — Telephone Encounter (Signed)
Called and spoke with Pt, advsd of results and to f/u with cardiologist or PCP

## 2017-05-30 NOTE — Telephone Encounter (Signed)
-----   Message from Pieter Partridge, DO sent at 05/28/2017  7:03 AM EST ----- Carotid ultrasound shows some mild plaque buildup in the carotid arteries but nothing significant or concerning.  Echocardiogram does not reveal a cardiac source of stroke.  It does show that the wall of the left heart chamber that is pumping blood is thickened.  Often this is seen in setting of people with high blood pressure.  This was noted on prior echocardiogram in 2012 but appears progressed.  However, the heart itself seems to be pumping well.  I would recommend following up with his cardiologist or PCP for further evaluation.

## 2017-08-12 ENCOUNTER — Other Ambulatory Visit: Payer: Self-pay | Admitting: Neurology

## 2017-08-20 ENCOUNTER — Ambulatory Visit: Payer: BLUE CROSS/BLUE SHIELD | Admitting: Neurology

## 2017-09-04 ENCOUNTER — Other Ambulatory Visit: Payer: Self-pay

## 2017-09-04 MED ORDER — CLOPIDOGREL BISULFATE 75 MG PO TABS: 75.0000 mg | ORAL_TABLET | Freq: Every day | ORAL | 3 refills | Status: DC

## 2018-05-22 ENCOUNTER — Other Ambulatory Visit: Payer: Self-pay | Admitting: Urology

## 2018-05-22 ENCOUNTER — Ambulatory Visit: Payer: BLUE CROSS/BLUE SHIELD | Admitting: Urology

## 2018-05-22 ENCOUNTER — Other Ambulatory Visit (HOSPITAL_COMMUNITY): Payer: Self-pay | Admitting: Urology

## 2018-05-22 DIAGNOSIS — R972 Elevated prostate specific antigen [PSA]: Secondary | ICD-10-CM | POA: Diagnosis not present

## 2018-06-19 ENCOUNTER — Other Ambulatory Visit (HOSPITAL_COMMUNITY): Payer: BLUE CROSS/BLUE SHIELD

## 2018-06-26 ENCOUNTER — Encounter (HOSPITAL_COMMUNITY): Payer: Self-pay

## 2018-06-26 ENCOUNTER — Other Ambulatory Visit: Payer: Self-pay | Admitting: Urology

## 2018-06-26 ENCOUNTER — Ambulatory Visit (HOSPITAL_COMMUNITY)
Admission: RE | Admit: 2018-06-26 | Discharge: 2018-06-26 | Disposition: A | Discharge status: Home / Self Care | Payer: BLUE CROSS/BLUE SHIELD | Source: Ambulatory Visit | Attending: Urology | Admitting: Urology

## 2018-06-26 DIAGNOSIS — R972 Elevated prostate specific antigen [PSA]: Secondary | ICD-10-CM | POA: Diagnosis present

## 2018-06-26 DIAGNOSIS — C61 Malignant neoplasm of prostate: Secondary | ICD-10-CM | POA: Insufficient documentation

## 2018-06-26 MED ORDER — GENTAMICIN SULFATE 40 MG/ML IJ SOLN
INTRAMUSCULAR | Status: AC
  Administered 2018-06-26: 80 mg via INTRAMUSCULAR
  Filled 2018-06-26: qty 2

## 2018-06-26 MED ORDER — LIDOCAINE HCL (PF) 2 % IJ SOLN
INTRAMUSCULAR | Status: AC
  Filled 2018-06-26: qty 10

## 2018-06-26 MED ORDER — GENTAMICIN SULFATE 40 MG/ML IJ SOLN
80.0000 mg | Freq: Once | INTRAMUSCULAR | Status: AC
  Administered 2018-06-26: 80 mg via INTRAMUSCULAR

## 2018-06-26 MED ORDER — LIDOCAINE HCL (PF) 2 % IJ SOLN: 10.0000 mL | Freq: Once | INTRAMUSCULAR | Status: DC

## 2018-07-10 ENCOUNTER — Ambulatory Visit: Payer: BLUE CROSS/BLUE SHIELD | Admitting: Urology

## 2018-07-10 DIAGNOSIS — C61 Malignant neoplasm of prostate: Secondary | ICD-10-CM

## 2018-07-18 ENCOUNTER — Other Ambulatory Visit: Payer: Self-pay | Admitting: Urology

## 2018-07-18 DIAGNOSIS — C61 Malignant neoplasm of prostate: Secondary | ICD-10-CM

## 2018-07-22 ENCOUNTER — Encounter: Payer: Self-pay | Admitting: Radiation Oncology

## 2018-07-22 NOTE — Progress Notes (Signed)
Completed note in different encounter.

## 2018-07-23 ENCOUNTER — Ambulatory Visit: Payer: BLUE CROSS/BLUE SHIELD

## 2018-07-23 ENCOUNTER — Telehealth: Payer: Self-pay | Admitting: Radiation Oncology

## 2018-07-23 ENCOUNTER — Ambulatory Visit
Admission: RE | Admit: 2018-07-23 | Discharge: 2018-07-23 | Disposition: A | Discharge status: Home / Self Care | Payer: BLUE CROSS/BLUE SHIELD | Source: Ambulatory Visit | Attending: Radiation Oncology | Admitting: Radiation Oncology

## 2018-07-23 DIAGNOSIS — N2889 Other specified disorders of kidney and ureter: Secondary | ICD-10-CM | POA: Insufficient documentation

## 2018-07-23 DIAGNOSIS — Z8042 Family history of malignant neoplasm of prostate: Secondary | ICD-10-CM | POA: Insufficient documentation

## 2018-07-23 DIAGNOSIS — R160 Hepatomegaly, not elsewhere classified: Secondary | ICD-10-CM | POA: Insufficient documentation

## 2018-07-23 DIAGNOSIS — K219 Gastro-esophageal reflux disease without esophagitis: Secondary | ICD-10-CM | POA: Insufficient documentation

## 2018-07-23 DIAGNOSIS — C61 Malignant neoplasm of prostate: Secondary | ICD-10-CM | POA: Insufficient documentation

## 2018-07-23 DIAGNOSIS — I10 Essential (primary) hypertension: Secondary | ICD-10-CM | POA: Insufficient documentation

## 2018-07-23 DIAGNOSIS — G4730 Sleep apnea, unspecified: Secondary | ICD-10-CM | POA: Insufficient documentation

## 2018-07-23 DIAGNOSIS — E669 Obesity, unspecified: Secondary | ICD-10-CM | POA: Insufficient documentation

## 2018-07-23 DIAGNOSIS — Z79899 Other long term (current) drug therapy: Secondary | ICD-10-CM | POA: Insufficient documentation

## 2018-07-23 DIAGNOSIS — Z87891 Personal history of nicotine dependence: Secondary | ICD-10-CM | POA: Insufficient documentation

## 2018-07-23 DIAGNOSIS — K449 Diaphragmatic hernia without obstruction or gangrene: Secondary | ICD-10-CM | POA: Insufficient documentation

## 2018-07-23 DIAGNOSIS — E785 Hyperlipidemia, unspecified: Secondary | ICD-10-CM | POA: Insufficient documentation

## 2018-07-23 HISTORY — DX: Malignant neoplasm of prostate: C61

## 2018-07-23 NOTE — Telephone Encounter (Signed)
Patient has not shown for consult appointment. Phoned to inquire. Patient confused appointment with Dr. Tyler Pita and Dr. Alexis Frock. Patient assumed they were one in the same and was planning to present to [facility] at 1545. Instructed patient to still go to [facility] at 1545 to discuss surgical options with Alexis Frock. Transferred patient to [facility]er to reschedule consult to discuss radiation options with Dr. Tammi Klippel since he an hour away.

## 2018-07-25 ENCOUNTER — Other Ambulatory Visit: Payer: Self-pay | Admitting: Urology

## 2018-08-08 ENCOUNTER — Ambulatory Visit (HOSPITAL_COMMUNITY)
Admission: RE | Admit: 2018-08-08 | Discharge: 2018-08-08 | Disposition: A | Discharge status: Home / Self Care | Payer: BLUE CROSS/BLUE SHIELD | Source: Ambulatory Visit | Attending: Urology | Admitting: Urology

## 2018-08-08 ENCOUNTER — Other Ambulatory Visit: Payer: Self-pay

## 2018-08-08 DIAGNOSIS — C61 Malignant neoplasm of prostate: Secondary | ICD-10-CM | POA: Diagnosis not present

## 2018-08-08 LAB — POCT I-STAT CREATININE: Creatinine, Ser: 1.9 mg/dL — ABNORMAL HIGH (ref 0.61–1.24)

## 2018-08-08 MED ORDER — IOHEXOL 350 MG/ML SOLN: 100.0000 mL | Freq: Once | INTRAVENOUS | Status: DC | PRN

## 2018-08-08 MED ORDER — IOHEXOL 300 MG/ML  SOLN
100.0000 mL | Freq: Once | INTRAMUSCULAR | Status: AC | PRN
  Administered 2018-08-08: 100 mL via INTRAVENOUS

## 2018-08-13 ENCOUNTER — Ambulatory Visit
Admission: RE | Admit: 2018-08-13 | Discharge: 2018-08-13 | Disposition: A | Discharge status: Home / Self Care | Payer: BLUE CROSS/BLUE SHIELD | Source: Ambulatory Visit | Attending: Radiation Oncology | Admitting: Radiation Oncology

## 2018-08-13 ENCOUNTER — Encounter: Payer: Self-pay | Admitting: Radiation Oncology

## 2018-08-13 ENCOUNTER — Other Ambulatory Visit: Payer: Self-pay

## 2018-08-13 ENCOUNTER — Telehealth: Payer: Self-pay | Admitting: Medical Oncology

## 2018-08-13 VITALS — Ht 75.0 in | Wt 312.0 lb

## 2018-08-13 DIAGNOSIS — K219 Gastro-esophageal reflux disease without esophagitis: Secondary | ICD-10-CM | POA: Diagnosis not present

## 2018-08-13 DIAGNOSIS — C61 Malignant neoplasm of prostate: Secondary | ICD-10-CM | POA: Diagnosis present

## 2018-08-13 DIAGNOSIS — Z8042 Family history of malignant neoplasm of prostate: Secondary | ICD-10-CM | POA: Diagnosis not present

## 2018-08-13 DIAGNOSIS — N2889 Other specified disorders of kidney and ureter: Secondary | ICD-10-CM | POA: Diagnosis not present

## 2018-08-13 DIAGNOSIS — E785 Hyperlipidemia, unspecified: Secondary | ICD-10-CM | POA: Diagnosis not present

## 2018-08-13 DIAGNOSIS — Z79899 Other long term (current) drug therapy: Secondary | ICD-10-CM | POA: Diagnosis not present

## 2018-08-13 DIAGNOSIS — Z87891 Personal history of nicotine dependence: Secondary | ICD-10-CM | POA: Diagnosis not present

## 2018-08-13 DIAGNOSIS — E669 Obesity, unspecified: Secondary | ICD-10-CM | POA: Diagnosis not present

## 2018-08-13 DIAGNOSIS — K449 Diaphragmatic hernia without obstruction or gangrene: Secondary | ICD-10-CM | POA: Diagnosis not present

## 2018-08-13 DIAGNOSIS — R160 Hepatomegaly, not elsewhere classified: Secondary | ICD-10-CM | POA: Diagnosis not present

## 2018-08-13 DIAGNOSIS — G4730 Sleep apnea, unspecified: Secondary | ICD-10-CM | POA: Diagnosis not present

## 2018-08-13 DIAGNOSIS — I10 Essential (primary) hypertension: Secondary | ICD-10-CM | POA: Diagnosis not present

## 2018-08-13 NOTE — Progress Notes (Signed)
Radiation Oncology         [phone] ________________________________  Initial Outpatient Consultation - Conducted via WebEx due to current COVID-19 concerns for limiting patient exposure  Name: Greg Gates. MRN: [id number]  Date: 08/13/2018  DOB: June 07, 1955  UY:QIHK, Weldon Picking, MD  McKenzie, Candee Furbish, MD   REFERRING PHYSICIAN: Cleon Gustin, MD  DIAGNOSIS: 63 y.o. gentleman with Stage T1c adenocarcinoma of the prostate with Gleason score of 3+4, and PSA of 9.8.    ICD-10-CM   1. Malignant neoplasm of prostate ([place]) C61     HISTORY OF PRESENT ILLNESS: Greg Gates. is a 63 y.o. male with a diagnosis of prostate cancer. He has a history of elevated PSA of 4.8 in 2017.  This was being followed by his PCP, Dr. Monico Blitz, and noted to be further elevated at 7.9 in early 2019 and up to 9.8 in October 2019.  Accordingly, he was referred for evaluation in urology by Dr. Alyson Ingles on 05/22/2018,  digital rectal examination was performed at that time revealing no nodules and he was started on Uroxatrol for increased LUTS.  The patient proceeded to transrectal ultrasound with 12 biopsies of the prostate on 06/26/2018.  The prostate volume measured 36 cc.  Out of 12 core biopsies, 4 were positive and 1 with HG/PIN.  The maximum Gleason score was 3+4, and this was seen in left base lateral, left mid lateral, left base, and right apex. HG/PIN was seen in the right base.  He underwent CT abdomen/pelvis on 08/08/2018 which showed no evidence of metastatic disease in the abdomen or pelvis.  Dr. Alyson Ingles mentioned ordering a bone scan to complete his staging, but it does not appear that this has been ordered or scheduled to date.  The patient reviewed the biopsy results with his urologist and he has kindly been referred today for discussion of potential radiation treatment options. He met with with Dr. Tresa Moore on 07/23/2018 and is tentatively scheduled for prostatectomy  on 09/06/2018.  PREVIOUS  RADIATION THERAPY: No  PAST MEDICAL HISTORY:  Past Medical History:  Diagnosis Date   GERD (gastroesophageal reflux disease)    HTN (hypertension)    Hyperlipidemia    Obesity (BMI 35.0-39.9 without comorbidity)    Prostate cancer (HCC)    Sleep apnea    No formal diagnosis      PAST SURGICAL HISTORY: Past Surgical History:  Procedure Laterality Date   COLONOSCOPY  01/09/2011   VQQ:VZDGLOVF hemorrhoids/multiple sessile polyp in the rectum and sigmoid colon   POLYPECTOMY  02/13/2011   IEP:PIRJJ polyps (hyperplastic) in the rectum/internal hemorrhoids/diverticulosis. Next TCS 02/2021 with 2 day clear liquids/overtube, propofol.    PROSTATE BIOPSY      FAMILY HISTORY:  Family History  Problem Relation Age of Onset   Diabetes Father    Peripheral vascular disease Father    Prostate cancer Father        uncle & cousin   High blood pressure Father    Diabetes Mother    Diabetes Sister    Prostate cancer Paternal Uncle    Prostate cancer Cousin    Breast cancer Cousin    Anesthesia problems Neg Hx    Hypotension Neg Hx    Malignant hyperthermia Neg Hx    Pseudochol deficiency Neg Hx     SOCIAL HISTORY:  Social History   Socioeconomic History   Marital status: Single    Spouse name: Not on file   Number of children: 1  Years of education: Not on file   Highest education level: High school graduate  Occupational History   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  Social Designer, fashion/clothing strain: Not on file   Food insecurity:    Worry: Not on file    Inability: Not on file   Transportation needs:    Medical: Not on file    Non-medical: Not on file  Tobacco Use   Smoking status: Former Smoker    Packs/day: 1.00    Years: 25.00    Pack years: 25.00    Types: Cigarettes    Last attempt to quit: 12/20/2000    Years since quitting: 17.6   Smokeless tobacco: Never Used    Substance and Sexual Activity   Alcohol use: Yes    Comment: daily etoh until 2002, now rarely   Drug use: Yes    Comment: quit 2002- acid, marijuana, cocaine, crack, hash    Sexual activity: Yes    Birth control/protection: None  Lifestyle   Physical activity:    Days per week: Not on file    Minutes per session: Not on file   Stress: Not on file  Relationships   Social connections:    Talks on phone: Not on file    Gets together: Not on file    Attends religious service: Not on file    Active member of club or organization: Not on file    Attends meetings of clubs or organizations: Not on file    Relationship status: Not on file   Intimate partner violence:    Fear of current or ex partner: Not on file    Emotionally abused: Not on file    Physically abused: Not on file    Forced sexual activity: Not on file  Other Topics Concern   Not on file  Social History Narrative   1 Grown biological son & step-daughter   Lives alone   Lives in a one story home. Drinks caffeine only occasionally. Is active at work, though does not exercise.    ALLERGIES: Lisinopril  MEDICATIONS:  Current Outpatient Medications  Medication Sig Dispense Refill   alfuzosin (UROXATRAL) 10 MG 24 hr tablet      allopurinol (ZYLOPRIM) 100 MG tablet Take 100 mg by mouth daily.     amLODipine (NORVASC) 5 MG tablet Take 1 tablet (5 mg total) by mouth daily. 30 tablet 1   atorvastatin (LIPITOR) 10 MG tablet Take 10 mg by mouth daily.     cloNIDine (CATAPRES) 0.3 MG tablet Take 0.3 mg by mouth 2 (two) times daily.     clopidogrel (PLAVIX) 75 MG tablet Take 1 tablet (75 mg total) by mouth daily. 90 tablet 3   furosemide (LASIX) 20 MG tablet      OTEZLA 30 MG TABS      pantoprazole (PROTONIX) 40 MG tablet TAKE 1 TABLET (40 MG TOTAL) BY MOUTH DAILY. TAKE 30-60 MIN BEFORE FIRST MEAL OF THE DAY 90 tablet 1   potassium chloride (MICRO-K) 10 MEQ CR capsule      triamcinolone cream (KENALOG)  0.1 %      No current facility-administered medications for this encounter.     REVIEW OF SYSTEMS:  On review of systems, the patient reports that he is doing well overall. He denies any chest pain, shortness of breath, cough, fevers, chills, night sweats, unintended weight changes. He denies any bowel disturbances, and denies abdominal  pain, nausea or vomiting. He denies any new musculoskeletal or joint aches or pains. His IPSS was 14, indicating moderate urinary symptoms despite taking Uroxatrol daily. His SHIM was 1, indicating he has severe erectile dysfunction. A complete review of systems is obtained and is otherwise negative.   PHYSICAL EXAM:  In general this is a well appearing African American male in no acute distress. He's alert and oriented x4 and appropriate throughout the examination. Cardiopulmonary assessment is negative for acute distress and he exhibits normal effort based on observation via teleconference format.   LABORATORY DATA:  Lab Results  Component Value Date   WBC 6.3 08/11/2012   HGB 13.0 08/11/2012   HCT 40.9 08/11/2012   MCV 78.5 08/11/2012   PLT 221 08/11/2012   Lab Results  Component Value Date   NA 136 08/10/2012   K 3.9 08/10/2012   CL 105 08/10/2012   CO2 21 08/10/2012   No results found for: ALT, AST, GGT, ALKPHOS, BILITOT   RADIOGRAPHY: Ct Abdomen Pelvis W Wo Contrast  Result Date: 08/08/2018 CLINICAL DATA:  Follow-up of recent diagnosis of prostate cancer. Hypertension. EXAM: CT ABDOMEN AND PELVIS WITHOUT AND WITH CONTRAST TECHNIQUE: Multidetector CT imaging of the abdomen and pelvis was performed following the standard protocol before and following the bolus administration of intravenous contrast. CONTRAST:  180m OMNIPAQUE IOHEXOL 300 MG/ML  SOLN COMPARISON:  11/12/2005 stone study. FINDINGS: Lower chest: Clear lung bases. Mild cardiomegaly, without pericardial or pleural effusion. Hepatobiliary: Hepatomegaly at 19.0 cm. No focal liver lesion.  Normal gallbladder, without biliary ductal dilatation. Pancreas: Normal, without mass or ductal dilatation. Spleen: Normal in size, without focal abnormality. Adrenals/Urinary Tract: Normal adrenal glands. No renal calculi or hydronephrosis. No hydroureter or ureteric calculi. No bladder calculi. A too small to characterize upper pole right renal 6 mm lesion on image 18/3 demonstrates complexity. There is also a hypoattenuating 1.0 cm interpolar left renal lesion on image 29/5 which is borderline too small to characterize but favored to represent a cyst. Normal post-contrast appearance of the urinary bladder. Stomach/Bowel: Normal stomach, without wall thickening. Normal colon, appendix, and terminal ileum. Normal small bowel. Vascular/Lymphatic: Aortic and branch vessel atherosclerosis. Multiple small pelvic sidewall nodes bilaterally. None are pathologic by size criteria. No abdominal adenopathy. Reproductive: There is edema surrounding the seminal vesicles and superior aspect of the prostate. Example image 66/4. No well-defined hematoma. Other: No significant free fluid. A small fat containing right inguinal hernia. Tiny periumbilical fat containing ventral wall hernia. Musculoskeletal: No acute osseous abnormality. Lower lumbar spondylosis. IMPRESSION: 1. No acute process or evidence of metastatic disease in the abdomen or pelvis. 2. Bilateral too small to characterize renal lesions. The left-sided lesion is most likely a cyst. The right-sided lesion demonstrates precontrast complexity. Most likely a hemorrhagic/proteinaceous cyst. Consider pre and post contrast abdominal MRI follow-up at 1 year. 3. Edema surrounding the seminal vesicles and superior aspect of the prostate is likely related to prior biopsy. No well-defined hematoma. 4. Small fat containing right inguinal and periumbilical hernias. 5.  Aortic Atherosclerosis (ICD10-I70.0). 6. Hepatomegaly. Electronically Signed   By: KAbigail MiyamotoM.D.   On:  08/08/2018 12:17      IMPRESSION/PLAN: This visit was conducted via WebEx to spare the patient unnecessary potential exposure in the healthcare setting during the current COVID-19 pandemic.  1. 63y.o. gentleman with Stage T1c adenocarcinoma of the prostate with Gleason Score of 3+4, and PSA of 9.8. We discussed the patient's workup and outlined the nature of prostate  cancer in this setting. The patient's T stage, Gleason's score, and PSA put him into the favorable intermediate risk group. Accordingly, he is eligible for a variety of potential treatment options including brachytherapy, 5.5 weeks of external radiation or prostatectomy. We discussed the available radiation techniques, and focused on the details and logistics and delivery. We discussed and outlined the risks, benefits, short and long-term effects associated with radiotherapy and compared and contrasted these with prostatectomy.   The patient focused most of his questions and interest in robotic-assisted laparoscopic radical prostatectomy.  We discussed some of the potential advantages of surgery including surgical staging, the availability of salvage radiotherapy to the prostatic fossa, and the confidence associated with immediate biochemical response.  We discussed some of the potential proven indications for postoperative radiotherapy including positive margins, extracapsular extension, and seminal vesicle involvement. We also talked about some of the other potential findings leading to a recommendation for radiotherapy including a non-zero postoperative PSA and positive lymph nodes.  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  At the conclusion of our conversation, the patient would like to proceed with prostatectomy as scheduled on 09/06/2018. We enjoyed meeting  with him today, and would be more than happy to continue to participate in his care if clinically indicated in the future.  We will look forward to following his progress via correspondence with urology.  Given current concerns for patient exposure during the COVID-19 pandemic, this encounter was conducted via teleconference. The patient was notified in advance and was offered a [place] meeting to allow for face to face communication and has given verbal consent for this type of encounter. The time spent during this encounter was 45 minutes. The attendants for this meeting include Tyler Pita MD, Ashlyn Bruning PA-C, Katie [place], and patient Greg Gates.. During the encounter, Tyler Pita MD, Ashlyn Bruning PA-C, and scribe, Wilburn Mylar were located at [place].  Patient Greg Gates. was located at home.    Nicholos Johns, PA-C    Tyler Pita, MD  [facility] Direct Dial: [phone]   Fax: [phone] [email or web address]   Skype   [email or web address]   This document serves as a record of services personally performed by Tyler Pita, MD and Freeman Caldron, PA-C. It was created on their behalf by Wilburn Mylar, a trained medical scribe. The creation of this record is based on the scribe's personal observations and the provider's statements to them. This document has been checked and approved by the attending provider.

## 2018-08-13 NOTE — Progress Notes (Signed)
See progress note under physician encounter. 

## 2018-08-13 NOTE — Progress Notes (Signed)
GU Location of Tumor / Histology: prostatic adenocarcinoma  If Prostate Cancer, Gleason Score is (3 + 4) and PSA is (9.8). Prostate volume: 36 grams.  Jerrell Belfast. was referred by Dr. Monico Blitz to Dr. Alyson Ingles for further evaluation of an elevated PSA in January 2020.  Biopsies of prostate (if applicable) revealed:    Past/Anticipated interventions by urology, if any: prostate biopsy, CT abd/pelvis (negative for evidence of metastatic disease in abd/pelvis), bone scan (not scheduled), referral for consideration of radiotherapy  Past/Anticipated interventions by medical oncology, if any: no  Weight changes, if any: no  Bowel/Bladder complaints, if any: IPSS 14. SHIM 1.    Nausea/Vomiting, if any: no  Pain issues, if any:  no  SAFETY ISSUES:  Prior radiation? no  Pacemaker/ICD? no  Possible current pregnancy? no, male patient  Is the patient on methotrexate? no  Current Complaints / other details:  63 year old male. Single. Works in Actuary at [employer].

## 2018-08-13 NOTE — Telephone Encounter (Signed)
Left message to introduce myself as the prostate nurse navigator and my role. I was unable to meet him this morning due to consult was conducted via telephone due to current COVID-19 concerns for limiting patient exposure. Per Dr. Johny Shears note and [facility] surgery schedule, he has chosen robotic prostatectomy as treatment for his prostate cancer.I wished him well with surgery and asked him to call me if I can assist him in the future.

## 2018-08-30 NOTE — Patient Instructions (Addendum)
Greg Gates.  08/30/2018   Your procedure is scheduled on: 09-06-2018   Report to [facility] Main  Entrance     Report to Admitting at 9:12AM    Call this number if you have problems the morning of surgery [phone]     Remember: Do not eat food or drink liquids :After Midnight.     BRUSH YOUR TEETH MORNING OF SURGERY AND RINSE YOUR MOUTH OUT, NO CHEWING GUM CANDY OR MINTS.     Take these medicines the morning of surgery with A SIP OF WATER: ALLOPURINOL, AMLODIPINE, CLONIDINE, OTEZLA, PANTOPRAZOLE                                 You may not have any metal on your body including hair pins and              piercings  Do not wear jewelry, make-up, lotions, powders or perfumes, deodorant             Men may shave face and neck.   Do not bring valuables to the hospital. [place].  Contacts, dentures or bridgework may not be worn into surgery.  Leave suitcase in the car. After surgery it may be brought to your room.     Patients discharged the day of surgery will not be allowed to drive home. IF YOU ARE HAVING SURGERY AND GOING HOME THE SAME DAY, YOU MUST HAVE AN ADULT TO DRIVE YOU HOME AND BE WITH YOU FOR 24 HOURS. YOU MAY GO HOME BY TAXI OR UBER OR ORTHERWISE, BUT AN ADULT MUST ACCOMPANY YOU HOME AND STAY WITH YOU FOR 24 HOURS.         Special Instructions: Please follow a Clear Liquid Diet the Day Prior to Surgery, per your surgeon's instructions       CLEAR LIQUID DIET   Foods Allowed                                                                     Foods Excluded  Coffee and tea, regular and decaf                             liquids that you cannot  Plain Jell-O in any flavor                                             see through such as: Fruit ices (not with fruit pulp)                                     milk, soups, orange juice  Iced Popsicles  All solid food Carbonated beverages, regular and diet                                    Cranberry, grape and apple juices Sports drinks like Gatorade Lightly seasoned clear broth or consume(fat free) Sugar, honey syrup  Sample Menu Breakfast                                Lunch                                     Supper Cranberry juice                    Beef broth                            Chicken broth Jell-O                                     Grape juice                           Apple juice Coffee or tea                        Jell-O                                      Popsicle                                                Coffee or tea                        Coffee or tea                Please read over the following fact sheets you were given: _____________________________________________________________________               [facility] - Preparing for Surgery Before surgery, you can play an important role.  Because skin is not sterile, your skin needs to be as free of germs as possible.  You can reduce the number of germs on your skin by washing with CHG (chlorahexidine gluconate) soap before surgery.  CHG is an antiseptic cleaner which kills germs and bonds with the skin to continue killing germs even after washing. Please DO NOT use if you have an allergy to CHG or antibacterial soaps.  If your skin becomes reddened/irritated stop using the CHG and inform your nurse when you arrive at Short Stay. Do not shave (including legs and underarms) for at least 48 hours prior to the first CHG shower.  You may shave your face/neck. Please follow these instructions carefully:  1.  Shower with CHG Soap the night before surgery and the  morning of Surgery.  2.  If you choose to wash your hair, wash your hair  first as usual with your  normal  shampoo.  3.  After you shampoo, rinse your hair and body thoroughly to remove the  shampoo.                           4.  Use CHG as you would  any other liquid soap.  You can apply chg directly  to the skin and wash                       Gently with a scrungie or clean washcloth.  5.  Apply the CHG Soap to your body ONLY FROM THE NECK DOWN.   Do not use on face/ open                           Wound or open sores. Avoid contact with eyes, ears mouth and genitals (private parts).                       Wash face,  Genitals (private parts) with your normal soap.             6.  Wash thoroughly, paying special attention to the area where your surgery  will be performed.  7.  Thoroughly rinse your body with warm water from the neck down.  8.  DO NOT shower/wash with your normal soap after using and rinsing off  the CHG Soap.                9.  Pat yourself dry with a clean towel.            10.  Wear clean pajamas.            11.  Place clean sheets on your bed the night of your first shower and do not  sleep with pets. Day of Surgery : Do not apply any lotions/deodorants the morning of surgery.  Please wear clean clothes to the hospital/surgery center.  FAILURE TO FOLLOW THESE INSTRUCTIONS MAY RESULT IN THE CANCELLATION OF YOUR SURGERY PATIENT SIGNATURE_________________________________  NURSE SIGNATURE__________________________________  ________________________________________________________________________

## 2018-09-02 ENCOUNTER — Inpatient Hospital Stay (HOSPITAL_COMMUNITY): Admission: RE | Admit: 2018-09-02 | Payer: BLUE CROSS/BLUE SHIELD | Source: Ambulatory Visit

## 2018-09-02 ENCOUNTER — Encounter (HOSPITAL_COMMUNITY)
Admission: RE | Admit: 2018-09-02 | Discharge: 2018-09-02 | Disposition: A | Discharge status: Home / Self Care | Payer: BLUE CROSS/BLUE SHIELD | Source: Ambulatory Visit | Attending: Urology | Admitting: Urology

## 2018-09-02 ENCOUNTER — Encounter (HOSPITAL_COMMUNITY): Payer: Self-pay

## 2018-09-02 ENCOUNTER — Other Ambulatory Visit: Payer: Self-pay

## 2018-09-02 DIAGNOSIS — I10 Essential (primary) hypertension: Secondary | ICD-10-CM | POA: Insufficient documentation

## 2018-09-02 DIAGNOSIS — Z01818 Encounter for other preprocedural examination: Secondary | ICD-10-CM | POA: Insufficient documentation

## 2018-09-02 LAB — BASIC METABOLIC PANEL
Anion gap: 7 (ref 5–15)
BUN: 21 mg/dL (ref 8–23)
CO2: 26 mmol/L (ref 22–32)
Calcium: 9.1 mg/dL (ref 8.9–10.3)
Chloride: 104 mmol/L (ref 98–111)
Creatinine, Ser: 1.61 mg/dL — ABNORMAL HIGH (ref 0.61–1.24)
GFR calc Af Amer: 53 mL/min — ABNORMAL LOW (ref >60)
GFR calc non Af Amer: 45 mL/min — ABNORMAL LOW (ref >60)
Glucose, Bld: 136 mg/dL — ABNORMAL HIGH (ref 70–99)
Potassium: 4.2 mmol/L (ref 3.5–5.1)
Sodium: 137 mmol/L (ref 135–145)

## 2018-09-02 LAB — CBC
HCT: 45.1 % (ref 39.0–52.0)
Hemoglobin: 13.7 g/dL (ref 13.0–17.0)
MCH: 24.6 pg — ABNORMAL LOW (ref 26.0–34.0)
MCHC: 30.4 g/dL (ref 30.0–36.0)
MCV: 81.1 fL (ref 80.0–100.0)
Platelets: 261 10*3/uL (ref 150–400)
RBC: 5.56 MIL/uL (ref 4.22–5.81)
RDW: 16 % — ABNORMAL HIGH (ref 11.5–15.5)
WBC: 6.8 10*3/uL (ref 4.0–10.5)
nRBC: 0 % (ref 0.0–0.2)

## 2018-09-02 NOTE — Progress Notes (Signed)
05-25-17 (Epic) ECHO

## 2018-09-03 NOTE — Progress Notes (Addendum)
Anesthesia Chart Review   Case:  [id number] Date/Time:  09/06/18 1057   Procedures:      XI ROBOTIC ASSISTED LAPAROSCOPIC RADICAL PROSTATECTOMY (N/A ) - 3 HRS     LYMPHADENECTOMY (Bilateral )     HERNIA REPAIR UMBILICAL ADULT (N/A )   Anesthesia type:  General   Pre-op diagnosis:  PROSTATE CANCER , UMBILICAL HERNIA   Location:  [facility] ROOM 03 / [facility]   Surgeon:  Alexis Frock, MD      DISCUSSION: 63 yo former smoker (25 pack years, quit 12/20/00) with h/o HTN, GERD, HLD, renal insufficiency, TIA (7/93/9030), umbilical hernia, prostate cancer scheduled for above procedure 09/06/18 with Dr. Alexis Frock.   Pt can proceed with planned procedure barring acute status change.  VS: BP (!) 169/85   Pulse 70   Temp 36.7 C (Oral)   Resp 18   Ht 6\' 3"  (1.905 m)   Wt (!) 142.7 kg   SpO2 98%   BMI 39.32 kg/m   PROVIDERS: Monico Blitz, MD is PCP last seen 06/22/2018  Tyler Pita, MD is Oncologist  LABS: Labs reviewed: Acceptable for surgery. (all labs ordered are listed, but only abnormal results are displayed)  Labs Reviewed  BASIC METABOLIC PANEL - Abnormal; Notable for the following components:      Result Value   Glucose, Bld 136 (*)    Creatinine, Ser 1.61 (*)    GFR calc non Af Amer 45 (*)    GFR calc Af Amer 53 (*)    All other components within normal limits  CBC - Abnormal; Notable for the following components:   MCH 24.6 (*)    RDW 16.0 (*)    All other components within normal limits     IMAGES: CT Abdomen Pelvis 08/08/2018 IMPRESSION: 1. No acute process or evidence of metastatic disease in the abdomen or pelvis. 2. Bilateral too small to characterize renal lesions. The left-sided lesion is most likely a cyst. The right-sided lesion demonstrates precontrast complexity. Most likely a hemorrhagic/proteinaceous cyst. Consider pre and post contrast abdominal MRI follow-up at 1 year. 3. Edema surrounding the seminal vesicles and superior aspect of the prostate is  likely related to prior biopsy. No well-defined hematoma. 4. Small fat containing right inguinal and periumbilical hernias. 5.  Aortic Atherosclerosis (ICD10-I70.0). 6. Hepatomegaly.  US Carotid Bilateral 05/25/17 IMPRESSION: Minimal amount of bilateral carotid plaque, right subjective greater than left, not resulting in a hemodynamically significant stenosis within either internal carotid artery.  EKG: 09/02/2018 Rate 73 bpm Sinus rhythm with sinus arrhythmia with occasional premature ventricular complexes Left axis deviation  Abnormal ECG   CV: Echo 05/25/17  Study Conclusions  - Left ventricle: The cavity size was normal. Wall thickness was   increased in a pattern of severe LVH. Systolic function was   vigorous. The estimated ejection fraction was in the range of 65%   to 70%. Wall motion was normal; there were no regional wall   motion abnormalities. Doppler parameters are consistent with   abnormal left ventricular relaxation (grade 1 diastolic   dysfunction). - Aortic valve: Mildly calcified annulus. Trileaflet. There was   mild regurgitation. - Aortic root: The aortic root was mildly dilated. - Mitral valve: There was mild regurgitation. - Left atrium: The atrium was mildly dilated. - Right atrium: Central venous pressure (est): 3 mm Hg. - Atrial septum: No defect or patent foramen ovale was identified. - Tricuspid valve: There was trivial regurgitation. - Pulmonary arteries: PA peak pressure: 37 mm  Hg (S). - Pericardium, extracardiac: There was no pericardial effusion.  Impressions:  - Severe LVH with LVEF 65-70% and grade 1 diastolic dysfunction.   Mildly dilated left atrium. Mild mitral regurgitation. Mildly   dilated aortic root. Trivial tricuspid regurgitation with   estimated PASP 37 mmHg.  ETT 08/11/2012 Conclusions:  Pre-test: no chest pain or SOB With lexiscan, felt flushed, a little SOB, felt HR increase.  No chest pain.  No clear ischemic changes  on ECG.  Past Medical History:  Diagnosis Date  . GERD (gastroesophageal reflux disease)   . HTN (hypertension)   . Hyperlipidemia   . Obesity (BMI 35.0-39.9 without comorbidity)   . Prostate cancer ([place])   . Sleep apnea    No formal diagnosis    Past Surgical History:  Procedure Laterality Date  . COLONOSCOPY  01/09/2011   XID:HWYSHUOH hemorrhoids/multiple sessile polyp in the rectum and sigmoid colon  . POLYPECTOMY  02/13/2011   FGB:MSXJD polyps (hyperplastic) in the rectum/internal hemorrhoids/diverticulosis. Next TCS 02/2021 with 2 day clear liquids/overtube, propofol.   Marland Kitchen PROSTATE BIOPSY      MEDICATIONS: . alfuzosin (UROXATRAL) 10 MG 24 hr tablet  . allopurinol (ZYLOPRIM) 100 MG tablet  . amLODipine (NORVASC) 5 MG tablet  . aspirin EC 325 MG tablet  . atorvastatin (LIPITOR) 10 MG tablet  . cloNIDine (CATAPRES) 0.3 MG tablet  . clopidogrel (PLAVIX) 75 MG tablet  . furosemide (LASIX) 20 MG tablet  . OTEZLA 30 MG TABS  . pantoprazole (PROTONIX) 40 MG tablet  . potassium chloride (MICRO-K) 10 MEQ CR capsule  . triamcinolone cream (KENALOG) 0.1 %   No current facility-administered medications for this encounter.    Maia Plan [facility] Pre-Surgical Testing [phone] 09/03/18 3:07 PM

## 2018-09-05 NOTE — Progress Notes (Signed)
Pt scheduled to have surgery on 09-06-18. Preliminary EKG has not been reviewed by Cardiologist. EKG lab contacted. They are currently experiencing a backlog. EKG reviewed with Konrad Felix, PAC. Pt okay to proceed with surgery.

## 2018-09-05 NOTE — Anesthesia Preprocedure Evaluation (Addendum)
Anesthesia Evaluation  Patient identified by MRN, date of birth, ID band Patient awake    Reviewed: Allergy & Precautions, NPO status , Patient's Chart, lab work & pertinent test results  History of Anesthesia Complications Negative for: history of anesthetic complications  Airway Mallampati: III  TM Distance: >3 FB Neck ROM: Full    Dental  (+) Dental Advisory Given, Poor Dentition,    Pulmonary sleep apnea (noncompliant with CPAP) , former smoker,    Pulmonary exam normal breath sounds clear to auscultation       Cardiovascular hypertension, Pt. on medications Normal cardiovascular exam Rhythm:Regular Rate:Normal     Neuro/Psych TIA (2019 per patient(has held Plavix fo >1 week))   GI/Hepatic Neg liver ROS, GERD  Medicated,  Endo/Other  Morbid obesity  Renal/GU Renal InsufficiencyRenal disease     Musculoskeletal negative musculoskeletal ROS (+)   Abdominal   Peds  Hematology negative hematology ROS (+)   Anesthesia Other Findings Day of surgery medications reviewed with the patient.  Reproductive/Obstetrics                            Anesthesia Physical Anesthesia Plan  ASA: III  Anesthesia Plan: General   Post-op Pain Management:    Induction: Intravenous  PONV Risk Score and Plan: 3 and Treatment may vary due to age or medical condition, Ondansetron, Dexamethasone and Midazolam  Airway Management Planned: Oral ETT and Video Laryngoscope Planned  Additional Equipment:   Intra-op Plan:   Post-operative Plan: Extubation in OR  Informed Consent: I have reviewed the patients History and Physical, chart, labs and discussed the procedure including the risks, benefits and alternatives for the proposed anesthesia with the patient or authorized representative who has indicated his/her understanding and acceptance.     Dental advisory given  Plan Discussed with:  CRNA  Anesthesia Plan Comments:        Anesthesia Quick Evaluation

## 2018-09-06 ENCOUNTER — Ambulatory Visit (HOSPITAL_COMMUNITY): Payer: BLUE CROSS/BLUE SHIELD | Admitting: Anesthesiology

## 2018-09-06 ENCOUNTER — Encounter (HOSPITAL_COMMUNITY)
Admission: AD | Disposition: A | Discharge status: Home / Self Care | Payer: Self-pay | Source: Home / Self Care | Attending: Urology

## 2018-09-06 ENCOUNTER — Inpatient Hospital Stay (HOSPITAL_COMMUNITY)
Admission: AD | Admit: 2018-09-06 | Discharge: 2018-09-08 | DRG: 708 | Disposition: A | Discharge status: Home / Self Care | Payer: BLUE CROSS/BLUE SHIELD | Attending: Urology | Admitting: Urology

## 2018-09-06 ENCOUNTER — Encounter (HOSPITAL_COMMUNITY): Payer: Self-pay | Admitting: *Deleted

## 2018-09-06 DIAGNOSIS — Z9119 Patient's noncompliance with other medical treatment and regimen: Secondary | ICD-10-CM

## 2018-09-06 DIAGNOSIS — Z803 Family history of malignant neoplasm of breast: Secondary | ICD-10-CM

## 2018-09-06 DIAGNOSIS — Z833 Family history of diabetes mellitus: Secondary | ICD-10-CM

## 2018-09-06 DIAGNOSIS — K429 Umbilical hernia without obstruction or gangrene: Secondary | ICD-10-CM | POA: Diagnosis present

## 2018-09-06 DIAGNOSIS — Z79899 Other long term (current) drug therapy: Secondary | ICD-10-CM

## 2018-09-06 DIAGNOSIS — G4730 Sleep apnea, unspecified: Secondary | ICD-10-CM | POA: Diagnosis present

## 2018-09-06 DIAGNOSIS — Z87891 Personal history of nicotine dependence: Secondary | ICD-10-CM

## 2018-09-06 DIAGNOSIS — Z8042 Family history of malignant neoplasm of prostate: Secondary | ICD-10-CM

## 2018-09-06 DIAGNOSIS — Z7902 Long term (current) use of antithrombotics/antiplatelets: Secondary | ICD-10-CM

## 2018-09-06 DIAGNOSIS — K219 Gastro-esophageal reflux disease without esophagitis: Secondary | ICD-10-CM | POA: Diagnosis present

## 2018-09-06 DIAGNOSIS — E785 Hyperlipidemia, unspecified: Secondary | ICD-10-CM | POA: Diagnosis present

## 2018-09-06 DIAGNOSIS — Z8249 Family history of ischemic heart disease and other diseases of the circulatory system: Secondary | ICD-10-CM

## 2018-09-06 DIAGNOSIS — Z8673 Personal history of transient ischemic attack (TIA), and cerebral infarction without residual deficits: Secondary | ICD-10-CM

## 2018-09-06 DIAGNOSIS — I10 Essential (primary) hypertension: Secondary | ICD-10-CM | POA: Diagnosis present

## 2018-09-06 DIAGNOSIS — K4020 Bilateral inguinal hernia, without obstruction or gangrene, not specified as recurrent: Secondary | ICD-10-CM | POA: Diagnosis present

## 2018-09-06 DIAGNOSIS — C61 Malignant neoplasm of prostate: Principal | ICD-10-CM | POA: Diagnosis present

## 2018-09-06 DIAGNOSIS — Z888 Allergy status to other drugs, medicaments and biological substances status: Secondary | ICD-10-CM

## 2018-09-06 DIAGNOSIS — Z86010 Personal history of colonic polyps: Secondary | ICD-10-CM

## 2018-09-06 HISTORY — PX: LYMPHADENECTOMY: SHX5960

## 2018-09-06 HISTORY — PX: INGUINAL HERNIA REPAIR: SHX194

## 2018-09-06 HISTORY — PX: ROBOT ASSISTED LAPAROSCOPIC RADICAL PROSTATECTOMY: SHX5141

## 2018-09-06 HISTORY — PX: UMBILICAL HERNIA REPAIR: SHX196

## 2018-09-06 LAB — HEMOGLOBIN AND HEMATOCRIT, BLOOD
HCT: 43.4 % (ref 39.0–52.0)
Hemoglobin: 12.9 g/dL — ABNORMAL LOW (ref 13.0–17.0)

## 2018-09-06 SURGERY — PROSTATECTOMY, RADICAL, ROBOT-ASSISTED, LAPAROSCOPIC
Anesthesia: General

## 2018-09-06 MED ORDER — SODIUM CHLORIDE 0.9 % IV BOLUS
1000.0000 mL | Freq: Once | INTRAVENOUS | Status: AC
  Administered 2018-09-06: 1000 mL via INTRAVENOUS

## 2018-09-06 MED ORDER — LIDOCAINE 2% (20 MG/ML) 5 ML SYRINGE
INTRAMUSCULAR | Status: DC | PRN
  Administered 2018-09-06: 100 mg via INTRAVENOUS

## 2018-09-06 MED ORDER — SODIUM CHLORIDE (PF) 0.9 % IJ SOLN
INTRAMUSCULAR | Status: AC
  Filled 2018-09-06: qty 10

## 2018-09-06 MED ORDER — SUCCINYLCHOLINE CHLORIDE 200 MG/10ML IV SOSY
PREFILLED_SYRINGE | INTRAVENOUS | Status: DC | PRN
  Administered 2018-09-06: 200 mg via INTRAVENOUS

## 2018-09-06 MED ORDER — ONDANSETRON HCL 4 MG/2ML IJ SOLN: 4.0000 mg | INTRAMUSCULAR | Status: DC | PRN

## 2018-09-06 MED ORDER — DIPHENHYDRAMINE HCL 12.5 MG/5ML PO ELIX: 12.5000 mg | ORAL_SOLUTION | Freq: Four times a day (QID) | ORAL | Status: DC | PRN

## 2018-09-06 MED ORDER — AMLODIPINE BESYLATE 5 MG PO TABS
5.0000 mg | ORAL_TABLET | Freq: Two times a day (BID) | ORAL | Status: DC
  Administered 2018-09-06 – 2018-09-08 (×4): 5 mg via ORAL
  Filled 2018-09-06 (×4): qty 1

## 2018-09-06 MED ORDER — DEXAMETHASONE SODIUM PHOSPHATE 10 MG/ML IJ SOLN
INTRAMUSCULAR | Status: DC | PRN
  Administered 2018-09-06: 10 mg via INTRAVENOUS

## 2018-09-06 MED ORDER — APREMILAST 30 MG PO TABS: 30.0000 mg | ORAL_TABLET | Freq: Two times a day (BID) | ORAL | Status: DC

## 2018-09-06 MED ORDER — OXYCODONE HCL 5 MG/5ML PO SOLN: 5.0000 mg | Freq: Once | ORAL | Status: DC | PRN

## 2018-09-06 MED ORDER — STERILE WATER FOR IRRIGATION IR SOLN
Status: DC | PRN
  Administered 2018-09-06: 1000 mL

## 2018-09-06 MED ORDER — SUGAMMADEX SODIUM 500 MG/5ML IV SOLN
INTRAVENOUS | Status: AC
  Filled 2018-09-06: qty 5

## 2018-09-06 MED ORDER — ACETAMINOPHEN 10 MG/ML IV SOLN
INTRAVENOUS | Status: AC
  Administered 2018-09-06: 16:00:00 1000 mg via INTRAVENOUS
  Filled 2018-09-06: qty 100

## 2018-09-06 MED ORDER — ONDANSETRON HCL 4 MG/2ML IJ SOLN
INTRAMUSCULAR | Status: DC | PRN
  Administered 2018-09-06: 4 mg via INTRAVENOUS

## 2018-09-06 MED ORDER — DIPHENHYDRAMINE HCL 50 MG/ML IJ SOLN: 12.5000 mg | Freq: Four times a day (QID) | INTRAMUSCULAR | Status: DC | PRN

## 2018-09-06 MED ORDER — HYDROCODONE-ACETAMINOPHEN 5-325 MG PO TABS: 1.0000 | ORAL_TABLET | Freq: Four times a day (QID) | ORAL | 0 refills | Status: AC | PRN

## 2018-09-06 MED ORDER — SUFENTANIL CITRATE 50 MCG/ML IV SOLN
INTRAVENOUS | Status: AC
  Filled 2018-09-06: qty 1

## 2018-09-06 MED ORDER — HYDROMORPHONE HCL 1 MG/ML IJ SOLN
0.2500 mg | INTRAMUSCULAR | Status: DC | PRN
  Administered 2018-09-06: 16:00:00 0.5 mg via INTRAVENOUS

## 2018-09-06 MED ORDER — ATORVASTATIN CALCIUM 10 MG PO TABS
10.0000 mg | ORAL_TABLET | Freq: Every day | ORAL | Status: DC
  Administered 2018-09-06 – 2018-09-07 (×2): 10 mg via ORAL
  Filled 2018-09-06 (×2): qty 1

## 2018-09-06 MED ORDER — SUGAMMADEX SODIUM 200 MG/2ML IV SOLN
INTRAVENOUS | Status: DC | PRN
  Administered 2018-09-06: 300 mg via INTRAVENOUS

## 2018-09-06 MED ORDER — LACTATED RINGERS IV SOLN
INTRAVENOUS | Status: DC
  Administered 2018-09-06 (×3): via INTRAVENOUS

## 2018-09-06 MED ORDER — DOCUSATE SODIUM 100 MG PO CAPS
100.0000 mg | ORAL_CAPSULE | Freq: Two times a day (BID) | ORAL | Status: DC
  Administered 2018-09-06 – 2018-09-08 (×4): 100 mg via ORAL
  Filled 2018-09-06 (×4): qty 1

## 2018-09-06 MED ORDER — OXYCODONE HCL 5 MG PO TABS
5.0000 mg | ORAL_TABLET | ORAL | Status: DC | PRN
  Administered 2018-09-07 – 2018-09-08 (×4): 5 mg via ORAL
  Filled 2018-09-06 (×4): qty 1

## 2018-09-06 MED ORDER — ROCURONIUM BROMIDE 10 MG/ML (PF) SYRINGE
PREFILLED_SYRINGE | INTRAVENOUS | Status: DC | PRN
  Administered 2018-09-06: 10 mg via INTRAVENOUS
  Administered 2018-09-06: 30 mg via INTRAVENOUS
  Administered 2018-09-06: 20 mg via INTRAVENOUS
  Administered 2018-09-06: 70 mg via INTRAVENOUS

## 2018-09-06 MED ORDER — EPHEDRINE SULFATE-NACL 50-0.9 MG/10ML-% IV SOSY
PREFILLED_SYRINGE | INTRAVENOUS | Status: DC | PRN
  Administered 2018-09-06 (×2): 10 mg via INTRAVENOUS

## 2018-09-06 MED ORDER — SUFENTANIL CITRATE 50 MCG/ML IV SOLN
INTRAVENOUS | Status: DC | PRN
  Administered 2018-09-06: 10 ug via INTRAVENOUS
  Administered 2018-09-06: 20 ug via INTRAVENOUS
  Administered 2018-09-06: 10 ug via INTRAVENOUS

## 2018-09-06 MED ORDER — BACITRACIN-NEOMYCIN-POLYMYXIN 400-5-5000 EX OINT: 1.0000 "application " | TOPICAL_OINTMENT | Freq: Three times a day (TID) | CUTANEOUS | Status: DC | PRN

## 2018-09-06 MED ORDER — SUCCINYLCHOLINE CHLORIDE 200 MG/10ML IV SOSY
PREFILLED_SYRINGE | INTRAVENOUS | Status: AC
  Filled 2018-09-06: qty 10

## 2018-09-06 MED ORDER — BUPIVACAINE LIPOSOME 1.3 % IJ SUSP
20.0000 mL | Freq: Once | INTRAMUSCULAR | Status: DC
  Filled 2018-09-06: qty 20

## 2018-09-06 MED ORDER — DEXAMETHASONE SODIUM PHOSPHATE 10 MG/ML IJ SOLN
INTRAMUSCULAR | Status: AC
  Filled 2018-09-06: qty 1

## 2018-09-06 MED ORDER — EPHEDRINE 5 MG/ML INJ
INTRAVENOUS | Status: AC
  Filled 2018-09-06: qty 10

## 2018-09-06 MED ORDER — PANTOPRAZOLE SODIUM 40 MG PO TBEC
40.0000 mg | DELAYED_RELEASE_TABLET | Freq: Every day | ORAL | Status: DC
  Administered 2018-09-07 – 2018-09-08 (×2): 40 mg via ORAL
  Filled 2018-09-06 (×2): qty 1

## 2018-09-06 MED ORDER — SODIUM CHLORIDE (PF) 0.9 % IJ SOLN
INTRAMUSCULAR | Status: DC | PRN
  Administered 2018-09-06: 20 mL

## 2018-09-06 MED ORDER — PROPOFOL 10 MG/ML IV BOLUS
INTRAVENOUS | Status: AC
  Filled 2018-09-06: qty 20

## 2018-09-06 MED ORDER — SODIUM CHLORIDE (PF) 0.9 % IJ SOLN
INTRAMUSCULAR | Status: AC
  Filled 2018-09-06: qty 20

## 2018-09-06 MED ORDER — MIDAZOLAM HCL 2 MG/2ML IJ SOLN
INTRAMUSCULAR | Status: DC | PRN
  Administered 2018-09-06: 2 mg via INTRAVENOUS

## 2018-09-06 MED ORDER — HYDROMORPHONE HCL 1 MG/ML IJ SOLN
INTRAMUSCULAR | Status: AC
  Filled 2018-09-06: qty 1

## 2018-09-06 MED ORDER — ONDANSETRON HCL 4 MG/2ML IJ SOLN
INTRAMUSCULAR | Status: AC
  Filled 2018-09-06: qty 2

## 2018-09-06 MED ORDER — OXYCODONE HCL 5 MG PO TABS: 5.0000 mg | ORAL_TABLET | Freq: Once | ORAL | Status: DC | PRN

## 2018-09-06 MED ORDER — PROPOFOL 10 MG/ML IV BOLUS
INTRAVENOUS | Status: DC | PRN
  Administered 2018-09-06: 200 mg via INTRAVENOUS

## 2018-09-06 MED ORDER — SULFAMETHOXAZOLE-TRIMETHOPRIM 800-160 MG PO TABS: 1.0000 | ORAL_TABLET | Freq: Two times a day (BID) | ORAL | 0 refills | Status: AC

## 2018-09-06 MED ORDER — DEXTROSE-NACL 5-0.45 % IV SOLN
INTRAVENOUS | Status: DC
  Administered 2018-09-06 – 2018-09-08 (×3): via INTRAVENOUS

## 2018-09-06 MED ORDER — PROMETHAZINE HCL 25 MG/ML IJ SOLN: 6.2500 mg | INTRAMUSCULAR | Status: DC | PRN

## 2018-09-06 MED ORDER — ROCURONIUM BROMIDE 10 MG/ML (PF) SYRINGE
PREFILLED_SYRINGE | INTRAVENOUS | Status: AC
  Filled 2018-09-06: qty 10

## 2018-09-06 MED ORDER — FUROSEMIDE 20 MG PO TABS
20.0000 mg | ORAL_TABLET | Freq: Every day | ORAL | Status: DC
  Administered 2018-09-07 – 2018-09-08 (×2): 20 mg via ORAL
  Filled 2018-09-06 (×2): qty 1

## 2018-09-06 MED ORDER — DEXTROSE 5 % IV SOLN
3.0000 g | INTRAVENOUS | Status: AC
  Administered 2018-09-06: 11:00:00 3 g via INTRAVENOUS
  Filled 2018-09-06: qty 3

## 2018-09-06 MED ORDER — CLONIDINE HCL 0.2 MG PO TABS
0.3000 mg | ORAL_TABLET | Freq: Two times a day (BID) | ORAL | Status: DC
  Administered 2018-09-06 – 2018-09-08 (×4): 0.3 mg via ORAL
  Filled 2018-09-06 (×4): qty 1

## 2018-09-06 MED ORDER — ALLOPURINOL 100 MG PO TABS
100.0000 mg | ORAL_TABLET | Freq: Every day | ORAL | Status: DC
  Administered 2018-09-07 – 2018-09-08 (×2): 100 mg via ORAL
  Filled 2018-09-06 (×2): qty 1

## 2018-09-06 MED ORDER — MIDAZOLAM HCL 2 MG/2ML IJ SOLN
INTRAMUSCULAR | Status: AC
  Filled 2018-09-06: qty 2

## 2018-09-06 MED ORDER — BELLADONNA ALKALOIDS-OPIUM 16.2-60 MG RE SUPP: 1.0000 | Freq: Four times a day (QID) | RECTAL | Status: DC | PRN

## 2018-09-06 MED ORDER — ACETAMINOPHEN 500 MG PO TABS
1000.0000 mg | ORAL_TABLET | Freq: Four times a day (QID) | ORAL | Status: AC
  Administered 2018-09-06 – 2018-09-07 (×4): 1000 mg via ORAL
  Filled 2018-09-06 (×4): qty 2

## 2018-09-06 MED ORDER — ACETAMINOPHEN 10 MG/ML IV SOLN
1000.0000 mg | Freq: Once | INTRAVENOUS | Status: DC | PRN
  Administered 2018-09-06: 16:00:00 1000 mg via INTRAVENOUS

## 2018-09-06 MED ORDER — LIDOCAINE 2% (20 MG/ML) 5 ML SYRINGE
INTRAMUSCULAR | Status: AC
  Filled 2018-09-06: qty 5

## 2018-09-06 MED ORDER — POTASSIUM CHLORIDE CRYS ER 10 MEQ PO TBCR: 10.0000 meq | EXTENDED_RELEASE_TABLET | Freq: Every day | ORAL | Status: DC

## 2018-09-06 MED ORDER — MAGNESIUM CITRATE PO SOLN
1.0000 | Freq: Once | ORAL | Status: DC
  Filled 2018-09-06: qty 296

## 2018-09-06 MED ORDER — HYDROMORPHONE HCL 1 MG/ML IJ SOLN
0.5000 mg | INTRAMUSCULAR | Status: DC | PRN
  Administered 2018-09-06: 1 mg via INTRAVENOUS
  Filled 2018-09-06: qty 1

## 2018-09-06 MED ORDER — LACTATED RINGERS IR SOLN
Status: DC | PRN
  Administered 2018-09-06: 1000 mL

## 2018-09-06 MED ORDER — SODIUM CHLORIDE 0.9 % IV SOLN: 20.0000 mL | Freq: Once | INTRAVENOUS | Status: DC

## 2018-09-06 MED ORDER — BUPIVACAINE LIPOSOME 1.3 % IJ SUSP
INTRAMUSCULAR | Status: DC | PRN
  Administered 2018-09-06: 20 mL

## 2018-09-06 SURGICAL SUPPLY — 67 items
APPLICATOR COTTON TIP 6 STRL (MISCELLANEOUS) ×3 IMPLANT
APPLICATOR COTTON TIP 6IN STRL (MISCELLANEOUS) ×5
CATH FOLEY 2WAY SLVR 18FR 30CC (CATHETERS) ×5 IMPLANT
CATH TIEMANN FOLEY 18FR 5CC (CATHETERS) ×5 IMPLANT
CHLORAPREP W/TINT 26 (MISCELLANEOUS) ×5 IMPLANT
CLIP VESOLOCK LG 6/CT PURPLE (CLIP) ×16 IMPLANT
CONT SPEC 4OZ CLIKSEAL STRL BL (MISCELLANEOUS) ×5 IMPLANT
COVER SURGICAL LIGHT HANDLE (MISCELLANEOUS) ×5 IMPLANT
COVER TIP SHEARS 8 DVNC (MISCELLANEOUS) ×3 IMPLANT
COVER TIP SHEARS 8MM DA VINCI (MISCELLANEOUS) ×2
COVER WAND RF STERILE (DRAPES) IMPLANT
CUTTER ECHEON FLEX ENDO 45 340 (ENDOMECHANICALS) ×5 IMPLANT
DECANTER SPIKE VIAL GLASS SM (MISCELLANEOUS) ×5 IMPLANT
DERMABOND ADVANCED (GAUZE/BANDAGES/DRESSINGS) ×2
DERMABOND ADVANCED .7 DNX12 (GAUZE/BANDAGES/DRESSINGS) ×3 IMPLANT
DRAPE ARM DVNC X/XI (DISPOSABLE) ×12 IMPLANT
DRAPE COLUMN DVNC XI (DISPOSABLE) ×3 IMPLANT
DRAPE DA VINCI XI ARM (DISPOSABLE) ×8
DRAPE DA VINCI XI COLUMN (DISPOSABLE) ×2
DRAPE SURG IRRIG POUCH 19X23 (DRAPES) ×5 IMPLANT
DRSG TEGADERM 4X4.75 (GAUZE/BANDAGES/DRESSINGS) ×5 IMPLANT
ELECT PENCIL ROCKER SW 15FT (MISCELLANEOUS) ×5 IMPLANT
ELECT REM PT RETURN 15FT ADLT (MISCELLANEOUS) ×5 IMPLANT
ETHIBOND 2 0 GREEN CT 2 30IN (SUTURE) ×8 IMPLANT
GAUZE SPONGE 2X2 8PLY STRL LF (GAUZE/BANDAGES/DRESSINGS) IMPLANT
GLOVE BIO SURGEON STRL SZ 6.5 (GLOVE) ×4 IMPLANT
GLOVE BIO SURGEONS STRL SZ 6.5 (GLOVE) ×1
GLOVE BIOGEL M STRL SZ7.5 (GLOVE) ×10 IMPLANT
GLOVE BIOGEL PI IND STRL 7.5 (GLOVE) ×3 IMPLANT
GLOVE BIOGEL PI INDICATOR 7.5 (GLOVE) ×2
GOWN STRL REUS W/TWL LRG LVL3 (GOWN DISPOSABLE) ×15 IMPLANT
HOLDER FOLEY CATH W/STRAP (MISCELLANEOUS) ×5 IMPLANT
IRRIG SUCT STRYKERFLOW 2 WTIP (MISCELLANEOUS) ×5
IRRIGATION SUCT STRKRFLW 2 WTP (MISCELLANEOUS) ×3 IMPLANT
IV LACTATED RINGERS 1000ML (IV SOLUTION) ×5 IMPLANT
KIT PROCEDURE DA VINCI SI (MISCELLANEOUS) ×2
KIT PROCEDURE DVNC SI (MISCELLANEOUS) ×3 IMPLANT
KIT TURNOVER KIT A (KITS) IMPLANT
NDL INSUFFLATION 14GA 120MM (NEEDLE) ×3 IMPLANT
NDL SPNL 22GX7 QUINCKE BK (NEEDLE) ×3 IMPLANT
NEEDLE INSUFFLATION 14GA 120MM (NEEDLE) ×5 IMPLANT
NEEDLE SPNL 22GX7 QUINCKE BK (NEEDLE) ×5 IMPLANT
PACK ROBOT UROLOGY CUSTOM (CUSTOM PROCEDURE TRAY) ×5 IMPLANT
PAD POSITIONING PINK XL (MISCELLANEOUS) ×5 IMPLANT
PORT ACCESS TROCAR AIRSEAL 12 (TROCAR) ×3 IMPLANT
PORT ACCESS TROCAR AIRSEAL 5M (TROCAR) ×2
RELOAD STAPLE 45 4.1 GRN THCK (STAPLE) ×3 IMPLANT
SEAL CANN UNIV 5-8 DVNC XI (MISCELLANEOUS) ×12 IMPLANT
SEAL XI 5MM-8MM UNIVERSAL (MISCELLANEOUS) ×8
SET TRI-LUMEN FLTR TB AIRSEAL (TUBING) ×5 IMPLANT
SOLUTION ELECTROLUBE (MISCELLANEOUS) ×5 IMPLANT
SPONGE GAUZE 2X2 STER 10/PKG (GAUZE/BANDAGES/DRESSINGS) ×2
SPONGE LAP 4X18 RFD (DISPOSABLE) ×5 IMPLANT
STAPLE RELOAD 45 GRN (STAPLE) ×3 IMPLANT
STAPLE RELOAD 45MM GREEN (STAPLE) ×2
SUT ETHILON 3 0 PS 1 (SUTURE) ×5 IMPLANT
SUT MNCRL AB 4-0 PS2 18 (SUTURE) ×10 IMPLANT
SUT NOVA NAB DX-16 0-1 5-0 T12 (SUTURE) ×4 IMPLANT
SUT PDS AB 1 CT1 27 (SUTURE) ×10 IMPLANT
SUT VIC AB 2-0 SH 27 (SUTURE) ×4
SUT VIC AB 2-0 SH 27X BRD (SUTURE) ×3 IMPLANT
SUT VICRYL 0 UR6 27IN ABS (SUTURE) ×5 IMPLANT
SUT VLOC BARB 180 ABS3/0GR12 (SUTURE) ×15
SUTURE VLOC BRB 180 ABS3/0GR12 (SUTURE) ×9 IMPLANT
SYR 27GX1/2 1ML LL SAFETY (SYRINGE) ×5 IMPLANT
TOWEL OR NON WOVEN STRL DISP B (DISPOSABLE) ×5 IMPLANT
WATER STERILE IRR 1000ML POUR (IV SOLUTION) ×5 IMPLANT

## 2018-09-06 NOTE — Anesthesia Postprocedure Evaluation (Signed)
Anesthesia Post Note  Patient: Greg Gates.  Procedure(s) Performed: XI ROBOTIC ASSISTED LAPAROSCOPIC RADICAL PROSTATECTOMY (N/A ) LYMPHADENECTOMY (Bilateral ) HERNIA REPAIR UMBILICAL ADULT (N/A ) LAPAROSCOPIC BILATERAL INGUINAL HERNIA REPAIR     Patient location during evaluation: PACU Anesthesia Type: General Level of consciousness: awake and alert Pain management: pain level controlled Vital Signs Assessment: post-procedure vital signs reviewed and stable Respiratory status: spontaneous breathing, nonlabored ventilation and respiratory function stable Cardiovascular status: blood pressure returned to baseline and stable Postop Assessment: no apparent nausea or vomiting Anesthetic complications: no    Last Vitals:  Vitals:   09/06/18 1600 09/06/18 1615  BP: 137/85   Pulse: 83 80  Resp: 17 11  Temp:    SpO2: 93% 94%    Last Pain:  Vitals:   09/06/18 1515  TempSrc:   PainSc: 0-No pain                 Brennan Bailey

## 2018-09-06 NOTE — Brief Op Note (Signed)
09/06/2018  2:24 PM  PATIENT:  Greg Gates.  63 y.o. male  PRE-OPERATIVE DIAGNOSIS:  PROSTATE CANCER , UMBILICAL HERNIA  POST-OPERATIVE DIAGNOSIS:  PROSTATE CANCER , UMBILICAL HERNIA, BILATERAL INGUINAL HERNIAS  PROCEDURE:  Procedure(s) with comments: XI ROBOTIC ASSISTED LAPAROSCOPIC RADICAL PROSTATECTOMY (N/A) - 3 HRS LYMPHADENECTOMY (Bilateral) HERNIA REPAIR UMBILICAL ADULT (N/A) LAPAROSCOPIC BILATERAL INGUINAL HERNIA REPAIR  SURGEON:  Surgeon(s) and Role:    * Alexis Frock, MD - Primary  PHYSICIAN ASSISTANT:   ASSISTANTS: Clemetine Marker PA   ANESTHESIA:   local and general  EBL:  100 mL   BLOOD ADMINISTERED:none  DRAINS: 1 - Foley to gravity; 2- JP to bulb   LOCAL MEDICATIONS USED:  MARCAINE     SPECIMEN:  Source of Specimen:  1 - prostatectomy, 2- pelvic lymph nodes; 3 - peri-prostatic fat  DISPOSITION OF SPECIMEN:  PATHOLOGY  COUNTS:  YES  TOURNIQUET:  * No tourniquets in log *  DICTATION: .Other Dictation: Dictation Number [id number]  PLAN OF CARE: Admit for overnight observation  PATIENT DISPOSITION:  PACU - hemodynamically stable.   Delay start of Pharmacological VTE agent (>24hrs) due to surgical blood loss or risk of bleeding: yes

## 2018-09-06 NOTE — Discharge Instructions (Signed)

## 2018-09-06 NOTE — Transfer of Care (Signed)
Immediate Anesthesia Transfer of Care Note  Patient: Dietrick Barris.  Procedure(s) Performed: XI ROBOTIC ASSISTED LAPAROSCOPIC RADICAL PROSTATECTOMY (N/A ) LYMPHADENECTOMY (Bilateral ) HERNIA REPAIR UMBILICAL ADULT (N/A ) LAPAROSCOPIC BILATERAL INGUINAL HERNIA REPAIR  Patient Location: PACU  Anesthesia Type:General  Level of Consciousness: drowsy  Airway & Oxygen Therapy: Patient Spontanous Breathing and Patient connected to face mask oxygen  Post-op Assessment: Report given to RN and Post -op Vital signs reviewed and stable  Post vital signs: Reviewed and stable  Last Vitals:  Vitals Value Taken Time  BP 157/98 09/06/2018  2:41 PM  Temp    Pulse 74 09/06/2018  2:42 PM  Resp 19 09/06/2018  2:42 PM  SpO2 94 % 09/06/2018  2:42 PM  Vitals shown include unvalidated device data.  Last Pain:  Vitals:   09/06/18 0910  TempSrc: Oral         Complications: No apparent anesthesia complications

## 2018-09-06 NOTE — Anesthesia Procedure Notes (Signed)
Procedure Name: Intubation Date/Time: 09/06/2018 11:06 AM Performed by: Sharlette Dense, CRNA Patient Re-evaluated:Patient Re-evaluated prior to induction Oxygen Delivery Method: Circle system utilized Preoxygenation: Pre-oxygenation with 100% oxygen Induction Type: IV induction and Rapid sequence Laryngoscope Size: Glidescope and 4 Grade View: Grade I Tube type: Parker flex tip Tube size: 7.5 mm Number of attempts: 1 Airway Equipment and Method: Video-laryngoscopy Placement Confirmation: ETT inserted through vocal cords under direct vision,  positive ETCO2 and breath sounds checked- equal and bilateral Secured at: 23 cm Tube secured with: Tape Dental Injury: Teeth and Oropharynx as per pre-operative assessment

## 2018-09-06 NOTE — H&P (Signed)
Greg Gates. is an 63 y.o. male.    Chief Complaint: Pre-Op Radical Prostatectomy  HPI:   1 - Moderate Risk Prostate Cancer - 4 cores up to 80% Grade 2 cancer by BX 06/2018 on eval PSA 9.8. TRUS 63mL, no median lobe. He has had a few friends with prostate cancer and is most interested in surgery.   2 - Umbilical Hernia - reducible umbilical hernia stable x years. NO h/o strangulation, About 1.5cm diameter.   PMH sig for HTN, HLD, TIA/Plavix (has been cleared to come off, no deficits, negative w/o). No prior surgeries. He works in [employer] at [employer]. His PCP is Monico Blitz MD.   Today " Greg Gates " is seen to proceed with radical prostatectomy and umbilical hernia repair.     Past Medical History:  Diagnosis Date  . GERD (gastroesophageal reflux disease)   . HTN (hypertension)   . Hyperlipidemia   . Obesity (BMI 35.0-39.9 without comorbidity)   . Prostate cancer ([place])   . Sleep apnea    No formal diagnosis    Past Surgical History:  Procedure Laterality Date  . COLONOSCOPY  01/09/2011   EHU:DJSHFWYO hemorrhoids/multiple sessile polyp in the rectum and sigmoid colon  . POLYPECTOMY  02/13/2011   VZC:HYIFO polyps (hyperplastic) in the rectum/internal hemorrhoids/diverticulosis. Next TCS 02/2021 with 2 day clear liquids/overtube, propofol.   Marland Kitchen PROSTATE BIOPSY      Family History  Problem Relation Age of Onset  . Diabetes Father   . Peripheral vascular disease Father   . Prostate cancer Father        uncle & cousin  . High blood pressure Father   . Diabetes Mother   . Diabetes Sister   . Prostate cancer Paternal Uncle   . Prostate cancer Cousin   . Breast cancer Cousin   . Anesthesia problems Neg Hx   . Hypotension Neg Hx   . Malignant hyperthermia Neg Hx   . Pseudochol deficiency Neg Hx    Social History:  reports that he quit smoking about 17 years ago. His smoking use included cigarettes. He has a 25.00 pack-year smoking history. He has never used  smokeless tobacco. He reports current alcohol use. He reports current drug use.  Allergies:  Allergies  Allergen Reactions  . Lisinopril Anaphylaxis    No medications prior to admission.    No results found for this or any previous visit (from the past 48 hour(s)). No results found.  Review of Systems  Constitutional: Negative.  Negative for chills and fever.  HENT: Negative.   Eyes: Negative.   Respiratory: Negative.   Cardiovascular: Negative.   Gastrointestinal: Negative.   Genitourinary: Negative.   Musculoskeletal: Negative.   Skin: Negative.   Neurological: Negative.   Endo/Heme/Allergies: Negative.   Psychiatric/Behavioral: Negative.     There were no vitals taken for this visit. Physical Exam  Constitutional: He appears well-developed.  HENT:  Head: Normocephalic.  Eyes: Pupils are equal, round, and reactive to light.  Neck: Normal range of motion.  Cardiovascular: Normal rate.  Respiratory: Effort normal.  GI: Soft.  Genitourinary:    Genitourinary Comments: No CVAT   Musculoskeletal: Normal range of motion.  Neurological: He is alert.  Skin: Skin is warm.  Psychiatric: He has a normal mood and affect.     Assessment/Plan  Proceed as planned with curative intent radical prostatectomy / node dissection / umbilcal hernia repair. Risks, benefits, alternatives, expected peri-op course discussed previously and reiterated today.  In response to the COVID-19 crises affecting the [place], the [place] issued a request that all hospitals and ambulatory surgery centers suspend all non-urgent surgery.  In keeping with this guidance this patient's surgery has NOT been postponed as the patient and I agree that significant harm could result from delay including progression of significant volume cancer.  We specifically discussed that there is some risk of COVID-19 transmission in the peri-operative setting and that  extra precautions are being taken to minimize this but there no guarantees that transmission will not occur. I answered all the patient's questions to the best of my ability.   Alexis Frock, MD 09/06/2018, 7:19 AM

## 2018-09-06 NOTE — Op Note (Signed)
NAME: Greg Gates, Greg Gates MEDICAL RECORD [id number] ACCOUNT [id number] DATE OF BIRTH:1956/02/28 FACILITY: [facility] LOCATION: [facility] PHYSICIAN:Greg Aldrete Tresa Moore, MD  OPERATIVE REPORT  DATE OF PROCEDURE:  09/06/2018  PREOPERATIVE DIAGNOSIS:  Umbilical hernia, moderate risk prostate cancer.  POSTOPERATIVE DIAGNOSIS:  Umbilical hernia, moderate risk prostate cancer, bilateral inguinal hernias, indirect type.  PROCEDURE: 1.  Robotic-assisted laparoscopic radical prostatectomy with bilateral pelvic lymphadenectomy. 2.  Open umbilical hernia repair. 3.  Bilateral inguinal hernia repair, laparoscopic.  ESTIMATED BLOOD LOSS:  440 mL  COMPLICATIONS:  None.  RISK SPECIMENS: 1.  Radical prostatectomy. 2.  Periprosthetic fat. 3.  Right external iliac lymph nodes. 4.  Right obturator lymph nodes. 5.  Left external iliac lymph nodes for permanent pathology.  FINDINGS: 1.  Approximately a nickel-sized umbilical hernia fascial defect. 2.  Right greater than left indirect inguinal hernias, right containing some bladder contents. 3.  Significant desmoplastic reaction around the prostate concerning for possible stage III disease.  ASSISTANT:  Clemetine Marker, PA  DRAINS:   1.  Jackson-Pratt drain to bulb suction. 2.  Foley catheter to straight drain.  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  He also has a  small umbilical hernia that periodically bothers him.  Staging imaging  was unremarkable for distant disease.  Options were discussed including surveillance protocols versus ablative therapy versus surgical extirpation and he wished to proceed with the latter with repair of his small umbilical hernia.  Informed consent was  obtained and placed in medical record.  DESCRIPTION OF PROCEDURE:  The patient was identified.  Procedure being radical prostatectomy was  confirmed.  Procedure timeout was performed.  Intravenous antibiotics administered.  General endotracheal anesthesia induced.  The patient was placed into a  low lithotomy position, sterile field was created prepping and draping the patient's penis, perineum and proximal thighs using iodine and his infra-xiphoid abdomen utilizing chlorhexidine gluconate after clipper shaving.  He was further fastened to  operative table using 3-inch tape with foam padding across the supraxiphoid chest.  A test of steep Trendelenburg positioning was performed and found to be suitably positioned.  Foley catheter was placed free to straight drain.  Next, a high-flow,  low-pressure pneumoperitoneum was obtained using Veress technique in the supraumbilical midline having passed the aspiration and drop test was purposely placed 3 cm superior to the umbilical hernia defect which was reduced prior to Veress needle  placement.  Next, an 8 mm robotic camera port was placed in the same location.  Laparoscopic examination peritoneal cavity revealed no significant adhesions, no visceral injury.  Distal ports were placed as follows:  Right paramedian 8 mm robotic port,  right far lateral 12 mm AirSeal assist port, right paramedian 5 mm suction port, left paramedian 8 mm robotic port, left far lateral 8 mm robotic port.  Robot was docked and passed electronic checks.  Initial attention was directed at development of the  space of Retzius.  Incision was made lateral to the right medial umbilical ligament from the midline towards the internal ring coursing along the iliac vessels towards the area of the right ureter.  Right vas deferens was encountered, ligated and placed  on gentle medial traction.  With the development at space of Retzius there was immediately noted to be a significant right-sided indirect inguinal hernia.  This did contain a significant amount of perivesical fat as well as some bladder contents.  This  was carefully  reduced and the edges of the fascia were carefully and easily visualized and the right bladder wall was swept away from the lateral pelvic sidewall towards the area of the endopelvic fascia on the right side.  A mirror image dissection was  performed on the left side and also a left indirect hernia was noted.  This only had some perivesical fat in it.  Anterior attachments were taken down using cautery scissors to expose the anterior base of the prostate which was defatted to better  demarcate the bladder neck prostate junction.  This was set aside and labeled as periprosthetic fat.  Next, the endopelvic fascia was carefully swept away from the lateral aspect of the prostate and base to apex orientation to expose the dorsal venous  complex.  It was controlled using vascular stapler, taking exquisite care to avoid membranous urethral injury which did not occur.  Next 0.2 mL of indocyanine green dye was injected into each lobe of the prostate using a percutaneously placed robotically  guided spinal needle with intervening suctioning to prevent dye spillage.  Attention was directed at the pelvic lymphadenectomy, first on the right side.  All fibrofatty tissue within the boundaries of the right external iliac artery, vein, pelvic  sidewall, iliac bifurcation were mobilized.  Lymphostasis was achieved with cold clips.  There were several sentinel lymph nodes in this area.  This was set aside and labeled as right external iliac lymph nodes, sentinel.  Next the right obturator group  was dissected free with the boundaries being right obturator nerve, pelvic sidewall, right external iliac vein.  Lymphostasis was achieved with cold clips was set aside labeled right obturator lymph nodes.  The right obturator nerve was inspected and  found to be uninjured.  A mirror image dissection was performed on the left side.  The left obturator group was somewhat abbreviated and included with the left external iliac specimen.   There left obturator nerve was also inspected and found to be  uninjured.  Attention was directed at the bladder neck dissection.  Bladder neck was identified by moving the Foley catheter back and forth and a lateral release was performed to better demarcate the bladder neck prostate junction, which was then  separated in an anterior, posterior direction given what appeared to be a rim of circular muscle fibers of each plane of dissection.  Posterior dissection was begun by incising approximately 6 mm inferior posterior to the posterior lip of the prostate  entering the plane of Denonvilliers.  This plane was exquisitely desmoplastic.  Despite very careful dissection, the bilateral vas and seminal vesicles all being identified were unable to be circumferentially mobilized and this is highly concerning for  locally advanced disease at this point.  Dissection proceeded within the plane Denonvilliers towards the area of the apex of the prostate.  Again this plane was quite desmoplastic.  This exposed the vascular pedicles of the prostate which were controlled  using a sequential clipping technique in a base to apex orientation first on the right side and then the left side.  Again, this area was quite desmoplastic.  Final apical dissection was performed in the anterior plane.  The membranous urethra was  coldly transected placing the prostate on superior traction.  This completely freed the prostatectomy specimen which was placed in EndoCatch bag for later retrieval.  Digital rectal exam was then performed under laparoscopic vision with indicator glove.   No evidence of rectal violation was noted.  Posterior dissection was  performed using 3-0 V-Loc suture reapproximating the posterior urethral plate the posterior bladder neck, bringing the structures into tension free apposition.  Next mucosa-to-mucosa  anastomosis was performed using double armed 3-0 V-Loc suture from the 6 o'clock to 12 o'clock position  resulting in excellent mucosal apposition.  A new Foley catheter was then placed per urethra, which irrigated quantitatively 10 mL around the balloon.   The pelvis was once again inspected and again after development at the space of Retzius there is significant bilateral indirect inguinal hernias.  I felt there was a significant risk of future bowel strangulation and repair was clearly warranted.  As  such, bilateral laparoscopic inguinal hernia repair was performed using #1 Ethibond figure-of-eight formation x2 each side, reapproximating the fascial edges taking exquisite care to avoid excessive tension on the gonadal vessels resulting in complete  resolution of the fascial defect.  Hemostasis appeared excellent.  Sponge, needle counts were correct.  Closed suction drain was brought through previous left lateral most robotic port site in the area of the peritoneal cavity.  The assistant port site  was closed with fascia using Carter-Thomason suture passer and 0 Vicryl.  The previous camera port was then extended inferiorly just lateral to the left umbilicus into the fascia so that it purposely incorporated the small umbilical hernia.  This was  closed at the level of fascia using figure-of-eight Novafil x7 followed by reapproximation of Scarpa's with a running Vicryl.  All incision sites were infiltrated with dilute lipolyzed Marcaine and closed level skin using subcuticular Monocryl by  Dermabond.  The patient tolerated the procedure terminated.  The patient tolerated the procedure well.  No immediate perioperative  complications.  The patient was taken to the postanesthesia care in stable condition.  Please note, first assistant Clemetine Marker was crucial for all portions of the procedure today.  She provided invaluable retraction, vascular stapling, vascular clipping, specimen manipulation and general first assistance.  TN/NUANCE  D:09/06/2018 T:09/06/2018 JOB:[id number]

## 2018-09-07 ENCOUNTER — Other Ambulatory Visit: Payer: Self-pay

## 2018-09-07 ENCOUNTER — Encounter (HOSPITAL_COMMUNITY): Payer: Self-pay | Admitting: Urology

## 2018-09-07 DIAGNOSIS — G4730 Sleep apnea, unspecified: Secondary | ICD-10-CM | POA: Diagnosis present

## 2018-09-07 DIAGNOSIS — Z79899 Other long term (current) drug therapy: Secondary | ICD-10-CM | POA: Diagnosis not present

## 2018-09-07 DIAGNOSIS — E785 Hyperlipidemia, unspecified: Secondary | ICD-10-CM | POA: Diagnosis present

## 2018-09-07 DIAGNOSIS — Z8673 Personal history of transient ischemic attack (TIA), and cerebral infarction without residual deficits: Secondary | ICD-10-CM | POA: Diagnosis not present

## 2018-09-07 DIAGNOSIS — Z86010 Personal history of colonic polyps: Secondary | ICD-10-CM | POA: Diagnosis not present

## 2018-09-07 DIAGNOSIS — Z803 Family history of malignant neoplasm of breast: Secondary | ICD-10-CM | POA: Diagnosis not present

## 2018-09-07 DIAGNOSIS — Z833 Family history of diabetes mellitus: Secondary | ICD-10-CM | POA: Diagnosis not present

## 2018-09-07 DIAGNOSIS — I10 Essential (primary) hypertension: Secondary | ICD-10-CM | POA: Diagnosis present

## 2018-09-07 DIAGNOSIS — Z8249 Family history of ischemic heart disease and other diseases of the circulatory system: Secondary | ICD-10-CM | POA: Diagnosis not present

## 2018-09-07 DIAGNOSIS — Z9119 Patient's noncompliance with other medical treatment and regimen: Secondary | ICD-10-CM | POA: Diagnosis not present

## 2018-09-07 DIAGNOSIS — C61 Malignant neoplasm of prostate: Secondary | ICD-10-CM | POA: Diagnosis present

## 2018-09-07 DIAGNOSIS — K429 Umbilical hernia without obstruction or gangrene: Secondary | ICD-10-CM | POA: Diagnosis present

## 2018-09-07 DIAGNOSIS — Z8042 Family history of malignant neoplasm of prostate: Secondary | ICD-10-CM | POA: Diagnosis not present

## 2018-09-07 DIAGNOSIS — Z87891 Personal history of nicotine dependence: Secondary | ICD-10-CM | POA: Diagnosis not present

## 2018-09-07 DIAGNOSIS — K219 Gastro-esophageal reflux disease without esophagitis: Secondary | ICD-10-CM | POA: Diagnosis present

## 2018-09-07 DIAGNOSIS — K4020 Bilateral inguinal hernia, without obstruction or gangrene, not specified as recurrent: Secondary | ICD-10-CM | POA: Diagnosis present

## 2018-09-07 DIAGNOSIS — Z888 Allergy status to other drugs, medicaments and biological substances status: Secondary | ICD-10-CM | POA: Diagnosis not present

## 2018-09-07 DIAGNOSIS — Z7902 Long term (current) use of antithrombotics/antiplatelets: Secondary | ICD-10-CM | POA: Diagnosis not present

## 2018-09-07 LAB — BASIC METABOLIC PANEL
Anion gap: 7 (ref 5–15)
BUN: 31 mg/dL — ABNORMAL HIGH (ref 8–23)
CO2: 23 mmol/L (ref 22–32)
Calcium: 8.6 mg/dL — ABNORMAL LOW (ref 8.9–10.3)
Chloride: 104 mmol/L (ref 98–111)
Creatinine, Ser: 2.51 mg/dL — ABNORMAL HIGH (ref 0.61–1.24)
GFR calc Af Amer: 31 mL/min — ABNORMAL LOW (ref >60)
GFR calc non Af Amer: 27 mL/min — ABNORMAL LOW (ref >60)
Glucose, Bld: 153 mg/dL — ABNORMAL HIGH (ref 70–99)
Potassium: 5.5 mmol/L — ABNORMAL HIGH (ref 3.5–5.1)
Sodium: 134 mmol/L — ABNORMAL LOW (ref 135–145)

## 2018-09-07 LAB — HEMOGLOBIN AND HEMATOCRIT, BLOOD
HCT: 41.7 % (ref 39.0–52.0)
Hemoglobin: 12.7 g/dL — ABNORMAL LOW (ref 13.0–17.0)

## 2018-09-07 MED ORDER — BISACODYL 10 MG RE SUPP
10.0000 mg | Freq: Once | RECTAL | Status: AC
  Administered 2018-09-07: 10 mg via RECTAL
  Filled 2018-09-07: qty 1

## 2018-09-07 MED ORDER — PROPOFOL 10 MG/ML IV BOLUS
INTRAVENOUS | Status: AC
  Filled 2018-09-07: qty 20

## 2018-09-07 NOTE — Progress Notes (Signed)
Patient ID: Greg Neuharth., male   DOB: June 18, 1955, 63 y.o.   MRN: [id number]  1 Day Post-Op Subjective: The patient is doing well.  No nausea or vomiting. Pain is adequately controlled. Cr increased today.  He has not yet ambulated.  Objective: Vital signs in last 24 hours: Temp:  [97.3 F (36.3 C)-99.7 F (37.6 C)] 98.4 F (36.9 C) (04/25 0527) Pulse Rate:  [65-88] 82 (04/25 0527) Resp:  [9-20] 17 (04/25 0527) BP: (135-164)/(77-98) 164/86 (04/25 0527) SpO2:  [91 %-100 %] 96 % (04/25 0527)  Intake/Output from previous day: 04/24 0701 - 04/25 0700 In: 3903.7 [I.V.:2754.7; IV Piggyback:1149] Out: 1180 [Urine:940; Drains:140; Blood:100] Intake/Output this shift: Total I/O In: -  Out: 200 [Urine:200]  Physical Exam:  General: Alert and oriented. GI: Soft, Nondistended. Incisions: Clean, dry, and intact Urine: Mostly clear Extremities: Nontender, no erythema, no edema.  Lab Results: Recent Labs    09/06/18 1521 09/07/18 0139  HGB 12.9* 12.7*  HCT 43.4 41.7   . Lab Results  Component Value Date   CREATININE 2.51 (H) 09/07/2018   CREATININE 1.61 (H) 09/02/2018   CREATININE 1.90 (H) 08/08/2018      Assessment/Plan: POD# 1 s/p robotic prostatectomy.  1) Continue IVF for hydration. Baseline Cr 1.5-1.9.  Will recheck in AM. 2) Ambulate, Incentive spirometry 3) Transition to oral pain medication 4) Dulcolax suppository 5) D/C pelvic drain 6) Plan for likely discharge tomorrow morning if renal function improved and he has a good day today   Pryor Curia. MD   LOS: 0 days   Dutch Gray 09/07/2018, 9:10 AM

## 2018-09-08 LAB — BASIC METABOLIC PANEL
Anion gap: 6 (ref 5–15)
BUN: 37 mg/dL — ABNORMAL HIGH (ref 8–23)
CO2: 23 mmol/L (ref 22–32)
Calcium: 8.7 mg/dL — ABNORMAL LOW (ref 8.9–10.3)
Chloride: 105 mmol/L (ref 98–111)
Creatinine, Ser: 2.52 mg/dL — ABNORMAL HIGH (ref 0.61–1.24)
GFR calc Af Amer: 31 mL/min — ABNORMAL LOW (ref >60)
GFR calc non Af Amer: 26 mL/min — ABNORMAL LOW (ref >60)
Glucose, Bld: 100 mg/dL — ABNORMAL HIGH (ref 70–99)
Potassium: 4.8 mmol/L (ref 3.5–5.1)
Sodium: 134 mmol/L — ABNORMAL LOW (ref 135–145)

## 2018-09-08 MED ORDER — BISACODYL 10 MG RE SUPP
10.0000 mg | Freq: Once | RECTAL | Status: AC
  Administered 2018-09-08: 10 mg via RECTAL
  Filled 2018-09-08: qty 1

## 2018-09-08 NOTE — Discharge Summary (Signed)
Date of admission: 09/06/2018  Date of discharge: 09/08/2018  Admission diagnosis: Prostate Cancer  Discharge diagnosis: Prostate Cancer  History and Physical: For full details, please see admission history and physical. Briefly, Greg Veasey. is a 63 y.o. gentleman with localized prostate cancer.  After discussing management/treatment options, he elected to proceed with surgical treatment.  Hospital Course: Greg Navarette. was taken to the operating room on 09/06/2018 and underwent a robotic assisted laparoscopic radical prostatectomy. He tolerated this procedure well and without complications. Postoperatively, he was able to be transferred to a regular hospital room following recovery from anesthesia.  He was able to begin ambulating the night of surgery. He remained hemodynamically stable overnight.  He had excellent urine output with appropriately minimal output from his pelvic drain and his pelvic drain was removed on POD #1.  He was transitioned to oral pain medication, tolerated a clear liquid diet, but was noted to have a rise of his Cr to 2.5 (baseline 1.6-1.9). This was rechecked on POD #2 and had stabilized and he was felt stable and had met all discharge criteria and was able to be discharged home later on POD#2.  Laboratory values:  Recent Labs    09/06/18 1521 09/07/18 0139  HGB 12.9* 12.7*  HCT 43.4 41.7    Disposition: Home  Discharge instruction: He was instructed to be ambulatory but to refrain from heavy lifting, strenuous activity, or driving. He was instructed on urethral catheter care.  Discharge medications:   Allergies as of 09/08/2018      Reactions   Lisinopril Anaphylaxis      Medication List    STOP taking these medications   alfuzosin 10 MG 24 hr tablet Commonly known as:  UROXATRAL   aspirin EC 325 MG tablet   clopidogrel 75 MG tablet Commonly known as:  PLAVIX     TAKE these medications   allopurinol 100 MG tablet Commonly known as:   ZYLOPRIM Take 100 mg by mouth daily.   amLODipine 5 MG tablet Commonly known as:  NORVASC Take 1 tablet (5 mg total) by mouth daily. What changed:  when to take this   atorvastatin 10 MG tablet Commonly known as:  LIPITOR Take 10 mg by mouth at bedtime.   cloNIDine 0.3 MG tablet Commonly known as:  CATAPRES Take 0.3 mg by mouth 2 (two) times daily.   furosemide 20 MG tablet Commonly known as:  LASIX Take 20 mg by mouth daily.   HYDROcodone-acetaminophen 5-325 MG tablet Commonly known as:  Norco Take 1-2 tablets by mouth every 6 (six) hours as needed for moderate pain or severe pain.   Otezla 30 MG Tabs Generic drug:  Apremilast Take 30 mg by mouth 2 (two) times daily.   pantoprazole 40 MG tablet Commonly known as:  PROTONIX TAKE 1 TABLET (40 MG TOTAL) BY MOUTH DAILY. TAKE 30-60 MIN BEFORE FIRST MEAL OF THE DAY   potassium chloride 10 MEQ CR capsule Commonly known as:  MICRO-K Take 10 mEq by mouth daily.   sulfamethoxazole-trimethoprim 800-160 MG tablet Commonly known as:  BACTRIM DS Take 1 tablet by mouth 2 (two) times daily. Start the day prior to foley removal appointment   triamcinolone cream 0.1 % Commonly known as:  KENALOG Apply 1 application topically daily as needed (psoriasis).       Followup: He will followup in 1 week for catheter removal and to discuss his surgical pathology results.  I will notify Dr. Tresa Moore so he can have  follow up labs to recheck his renal function as an outpatient.

## 2018-09-08 NOTE — Progress Notes (Signed)
Patient ID: Greg Gates., male   DOB: [birth year], 63 y.o.   MRN: [id number]  2 Days Post-Op Subjective: Pt ambulating well now.  No flatus yet.  Pain controlled.  Objective: Vital signs in last 24 hours: Temp:  [98.5 F (36.9 C)-98.9 F (37.2 C)] 98.5 F (36.9 C) (04/26 0440) Pulse Rate:  [61-66] 64 (04/26 0440) Resp:  [16-20] 18 (04/26 0440) BP: (138-159)/(77-96) 139/91 (04/26 0440) SpO2:  [93 %-96 %] 93 % (04/26 0440)  Intake/Output from previous day: 04/25 0701 - 04/26 0700 In: 960 [P.O.:960] Out: 2445 [Urine:2425; Drains:20] Intake/Output this shift: Total I/O In: -  Out: 475 [Urine:475]  Physical Exam:  General: Alert and oriented Abdomen: Soft, mild to moderate distention Incisions: C/D/I GU: Urine clear Ext: NT, No erythema  Lab Results: Recent Labs    09/06/18 1521 09/07/18 0139  HGB 12.9* 12.7*  HCT 43.4 41.7   BMET Recent Labs    09/07/18 0139 09/08/18 0514  NA 134* 134*  K 5.5* 4.8  CL 104 105  CO2 23 23  GLUCOSE 153* 100*  BUN 31* 37*  CREATININE 2.51* 2.52*  CALCIUM 8.6* 8.7*     Studies/Results: No results found.  Assessment/Plan: POD # 2 s/p RAL radical prostatectomy - Renal function stabilized -Hold K supplement - Ok for discharge today    LOS: 1 day   Dutch Gray 09/08/2018, 8:41 AM

## 2019-03-27 ENCOUNTER — Other Ambulatory Visit (HOSPITAL_COMMUNITY): Payer: Self-pay | Admitting: Nephrology

## 2019-03-27 DIAGNOSIS — N289 Disorder of kidney and ureter, unspecified: Secondary | ICD-10-CM

## 2019-04-04 ENCOUNTER — Ambulatory Visit (HOSPITAL_COMMUNITY): Admission: RE | Admit: 2019-04-04 | Payer: BC Managed Care – PPO | Source: Ambulatory Visit

## 2019-04-04 ENCOUNTER — Encounter (HOSPITAL_COMMUNITY): Payer: Self-pay

## 2019-04-24 ENCOUNTER — Ambulatory Visit (HOSPITAL_COMMUNITY)
Admission: RE | Admit: 2019-04-24 | Discharge: 2019-04-24 | Disposition: A | Discharge status: Home / Self Care | Payer: BC Managed Care – PPO | Source: Ambulatory Visit | Attending: Nephrology | Admitting: Nephrology

## 2019-04-24 ENCOUNTER — Other Ambulatory Visit: Payer: Self-pay

## 2019-04-24 DIAGNOSIS — N289 Disorder of kidney and ureter, unspecified: Secondary | ICD-10-CM | POA: Diagnosis not present

## 2019-08-04 ENCOUNTER — Encounter: Payer: Self-pay | Admitting: Gastroenterology

## 2019-08-13 ENCOUNTER — Ambulatory Visit: Payer: BC Managed Care – PPO | Admitting: Gastroenterology

## 2020-08-07 IMAGING — CT CT ABDOMEN AND PELVIS WITHOUT AND WITH CONTRAST
3 of 9 series · 12 of 46 positions shown, 18 images · IV contrast (omnipaque)
Comparison: 11/12/2005 stone study.

CLINICAL DATA: Follow-up of recent diagnosis of prostate cancer.
Hypertension.

EXAM:
CT ABDOMEN AND PELVIS WITHOUT AND WITH CONTRAST
TECHNIQUE: Multidetector CT imaging of the abdomen and pelvis was performed
following the standard protocol before and following the bolus
administration of intravenous contrast.
CONTRAST:  100mL OMNIPAQUE IOHEXOL 300 MG/ML  SOLN

[Series 3: axial st · axial · 0.91mm/px · z∈[+1164,+1469]mm · 6 of 87 slices shown, 11 images]
[im 13/87  soft-tissue]
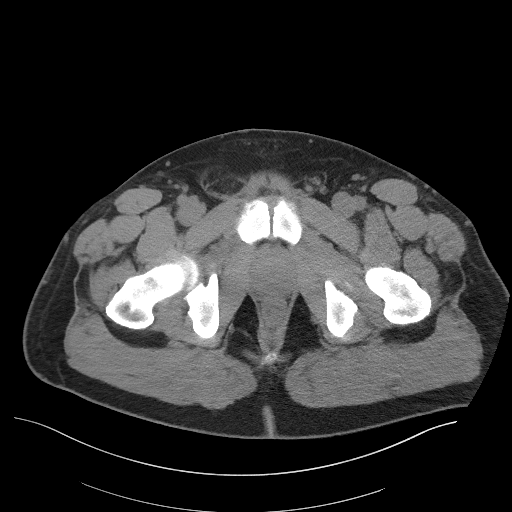
[im 13/87  bone]
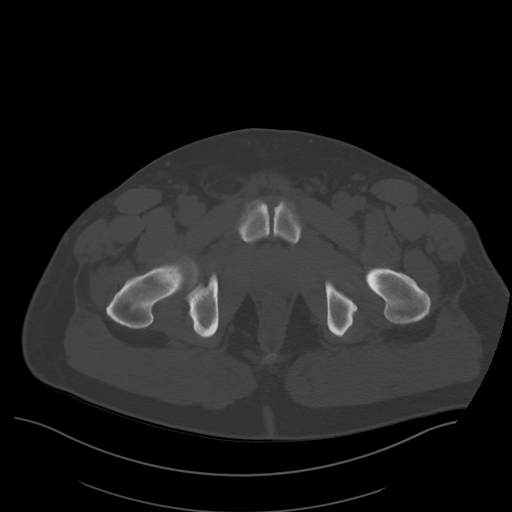
[im 25/87  soft-tissue]
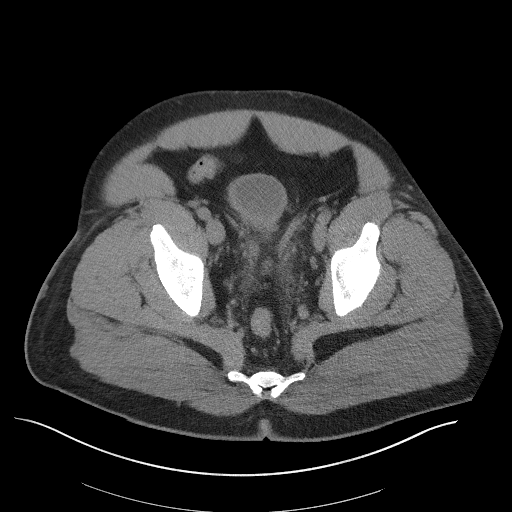
[im 37/87  soft-tissue]
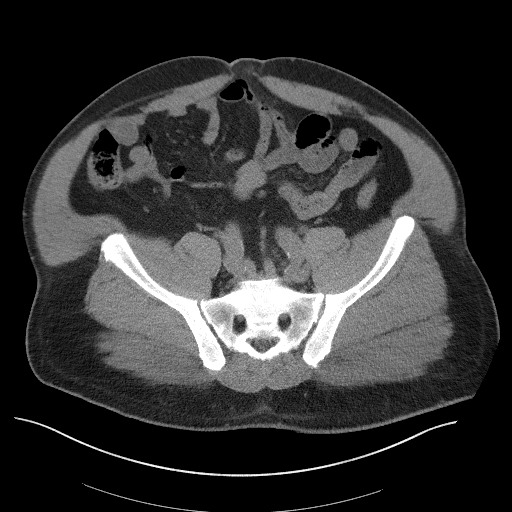
[im 37/87  lung]
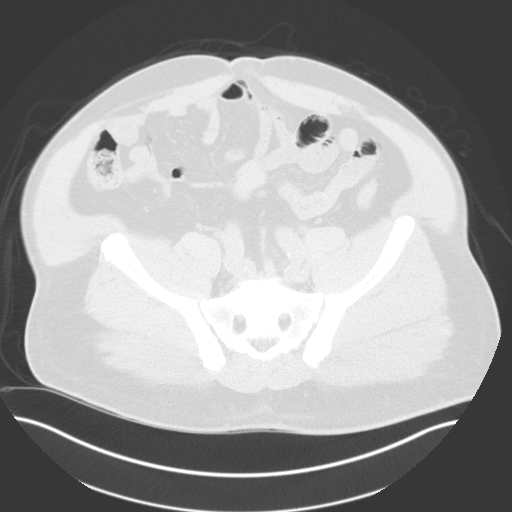
[im 50/87  soft-tissue]
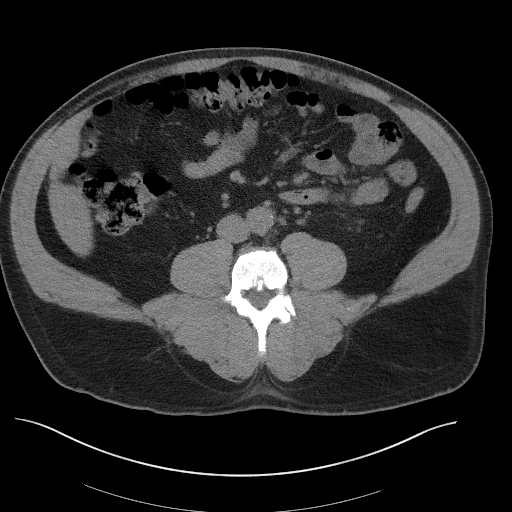
[im 50/87  lung]
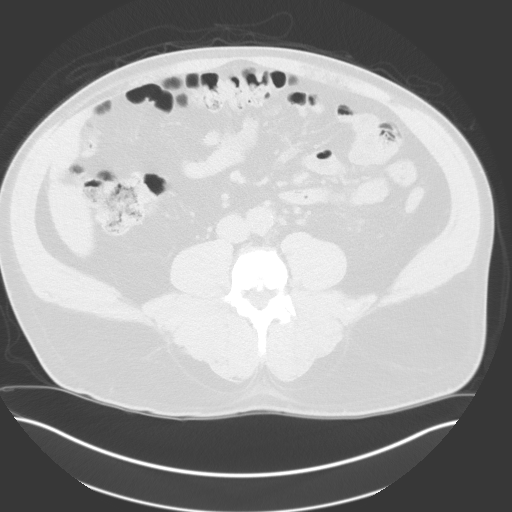
[im 62/87  soft-tissue]
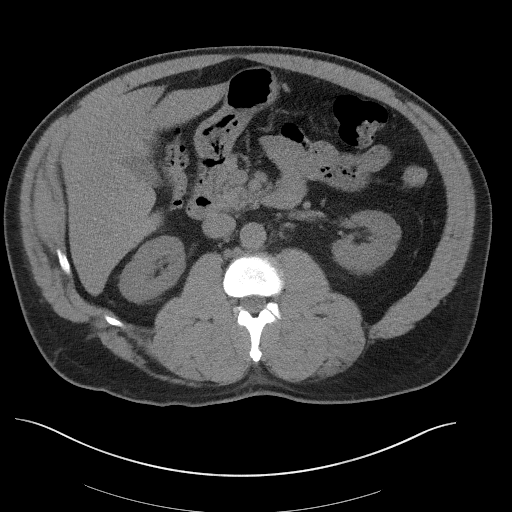
[im 62/87  lung]
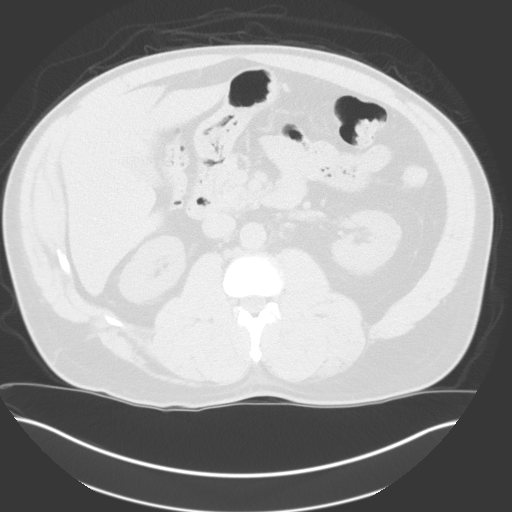
[im 74/87  soft-tissue]
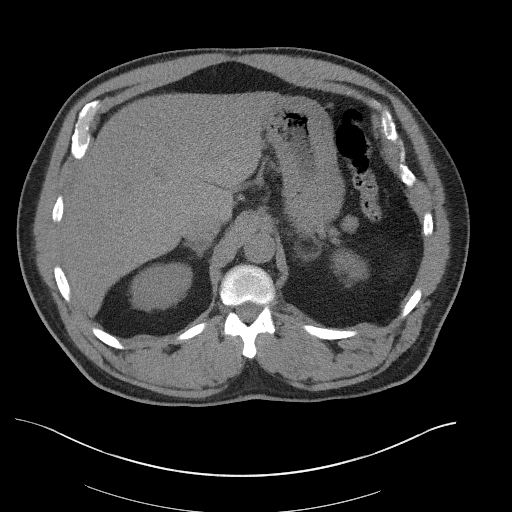
[im 74/87  lung]
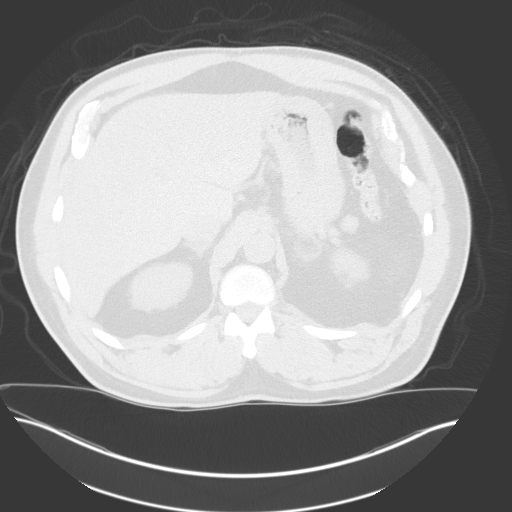

[Series 5: delays · axial · 0.91mm/px · z∈[+1169,+1289]mm · 3 of 86 slices shown]
[im 13/86  soft-tissue]
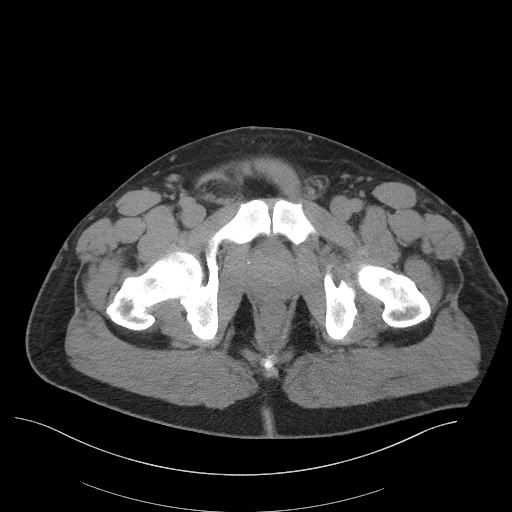
[im 25/86  soft-tissue]
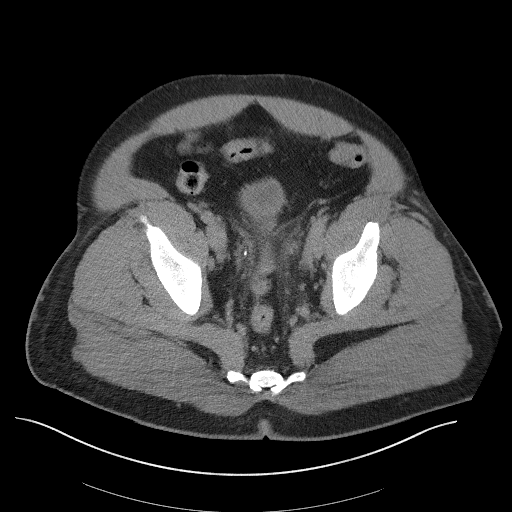
[im 37/86  soft-tissue]
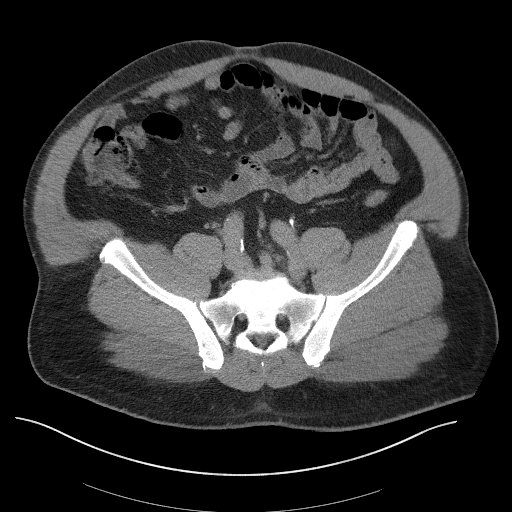

[Series 11: coronal st · coronal · 0.85mm/px · 3 of 141 slices shown, 4 images]
[im 29/141  soft-tissue]
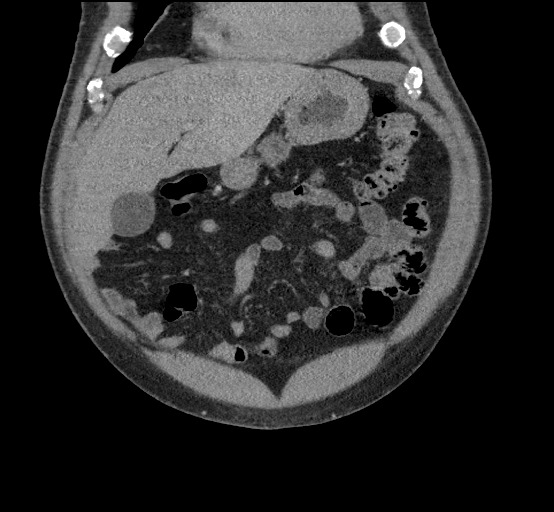
[im 57/141  soft-tissue]
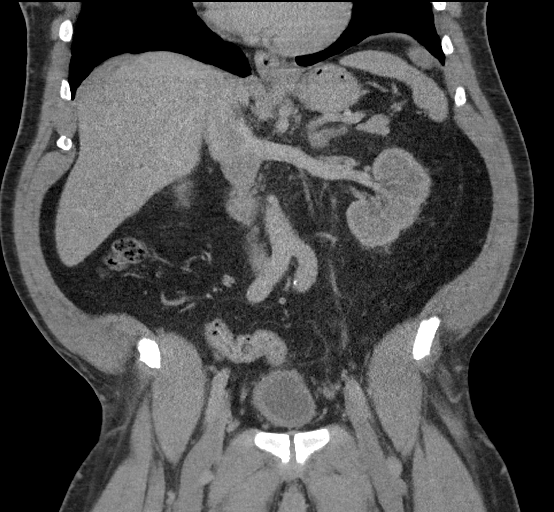
[im 57/141  bone]
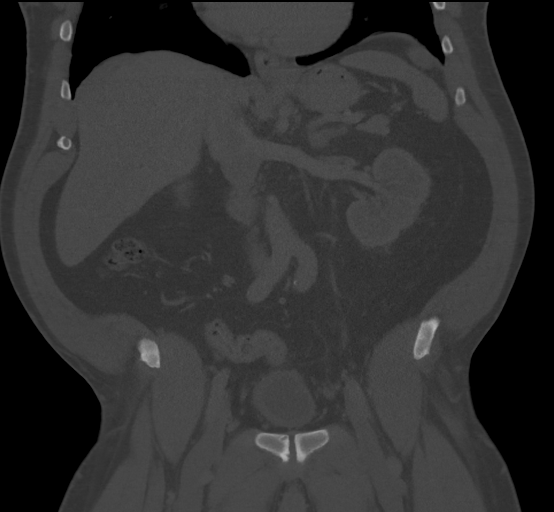
[im 85/141  soft-tissue]
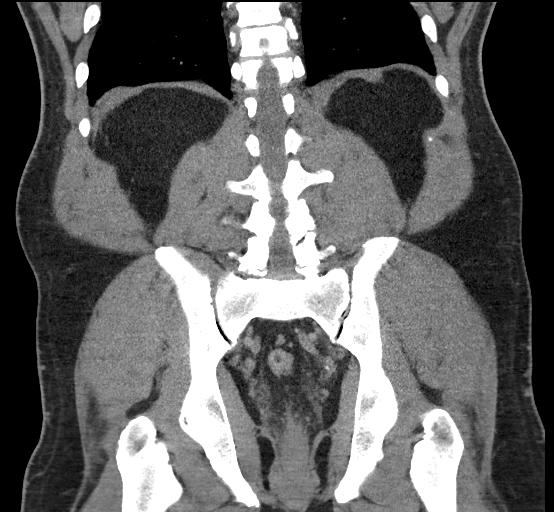

[12 of 46 positions shown; findings below may reference images not displayed]

FINDINGS: Lower chest: Clear lung bases. Mild cardiomegaly, without
pericardial or pleural effusion.

Hepatobiliary: Hepatomegaly at 19.0 cm. No focal liver lesion.
Normal gallbladder, without biliary ductal dilatation.

Pancreas: Normal, without mass or ductal dilatation.

Spleen: Normal in size, without focal abnormality.

Adrenals/Urinary Tract: Normal adrenal glands. No renal calculi or
hydronephrosis. No hydroureter or ureteric calculi. No bladder
calculi. A too small to characterize upper pole right renal 6 mm
lesion on image [DATE] demonstrates complexity. There is also a
hypoattenuating 1.0 cm interpolar left renal lesion on image [DATE]
which is borderline too small to characterize but favored to
represent a cyst.

Normal post-contrast appearance of the urinary bladder.

Stomach/Bowel: Normal stomach, without wall thickening. Normal
colon, appendix, and terminal ileum. Normal small bowel.

Vascular/Lymphatic: Aortic and branch vessel atherosclerosis.
Multiple small pelvic sidewall nodes bilaterally. None are
pathologic by size criteria. No abdominal adenopathy.

Reproductive: There is edema surrounding the seminal vesicles and
superior aspect of the prostate. Example image 66/4. No well-defined
hematoma.

Other: No significant free fluid. A small fat containing right
inguinal hernia. Tiny periumbilical fat containing ventral wall
hernia.

Musculoskeletal: No acute osseous abnormality. Lower lumbar
spondylosis.
IMPRESSION: 1. No acute process or evidence of metastatic disease in the abdomen
or pelvis.
2. Bilateral too small to characterize renal lesions. The left-sided
lesion is most likely a cyst. The right-sided lesion demonstrates
precontrast complexity. Most likely a hemorrhagic/proteinaceous
cyst. Consider pre and post contrast abdominal MRI follow-up at 1
year.
3. Edema surrounding the seminal vesicles and superior aspect of the
prostate is likely related to prior biopsy. No well-defined
hematoma.
4. Small fat containing right inguinal and periumbilical hernias.
5.  Aortic Atherosclerosis ([id number]).
6. Hepatomegaly.

## 2021-01-11 ENCOUNTER — Encounter: Payer: Self-pay | Admitting: *Deleted

## 2021-04-23 IMAGING — US US RENAL
1 series · 14 of 25 positions shown · non-contrast
Comparison: CT scan of August 08, 2018.

CLINICAL DATA: Abnormal renal function.

EXAM:
RENAL / URINARY TRACT ULTRASOUND COMPLETE

[Series 1: us renal · 14 of 39 slices shown]
[im 1/39]
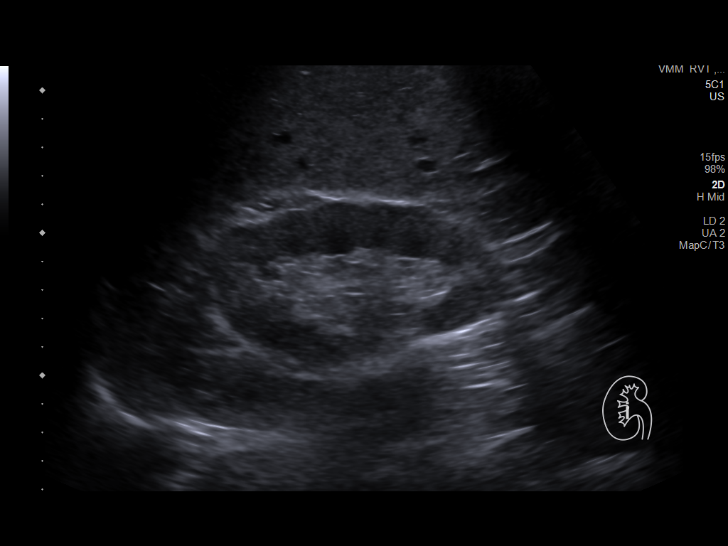
[im 4/39]
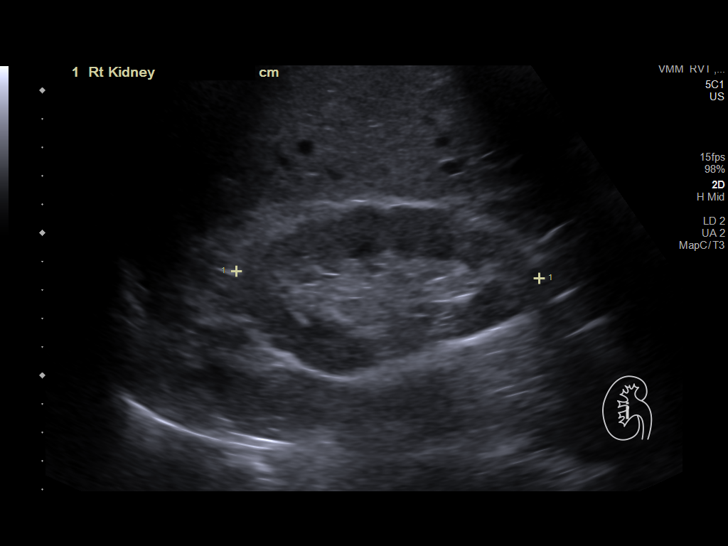
[im 7/39]
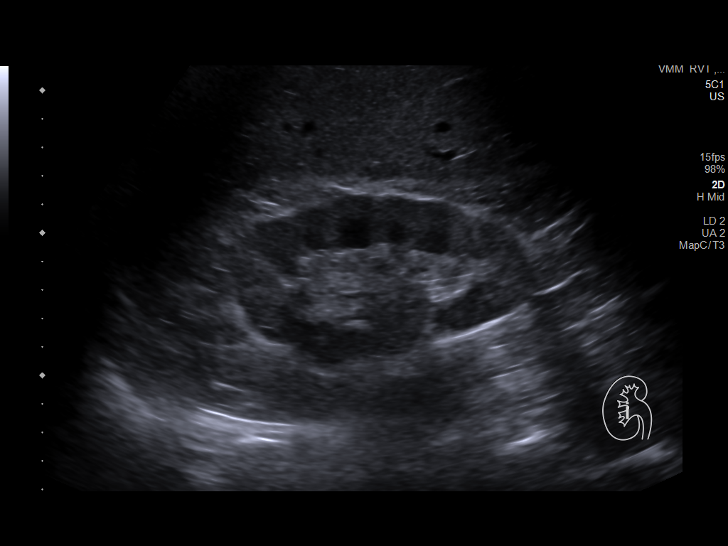
[im 10/39]
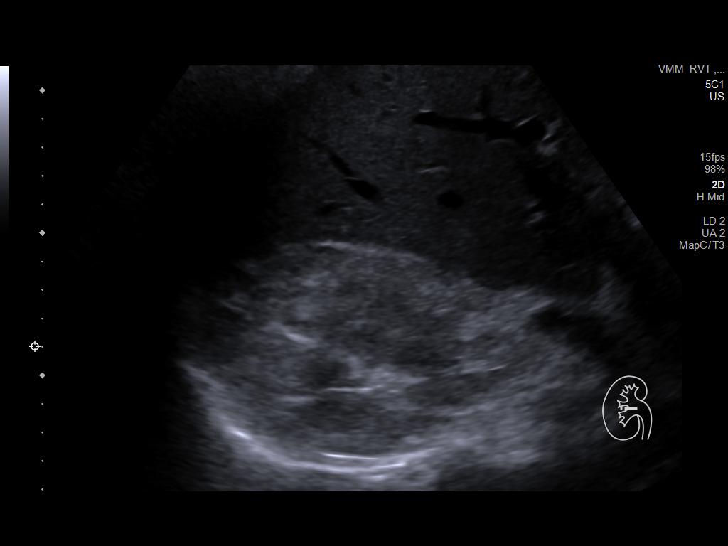
[im 13/39]
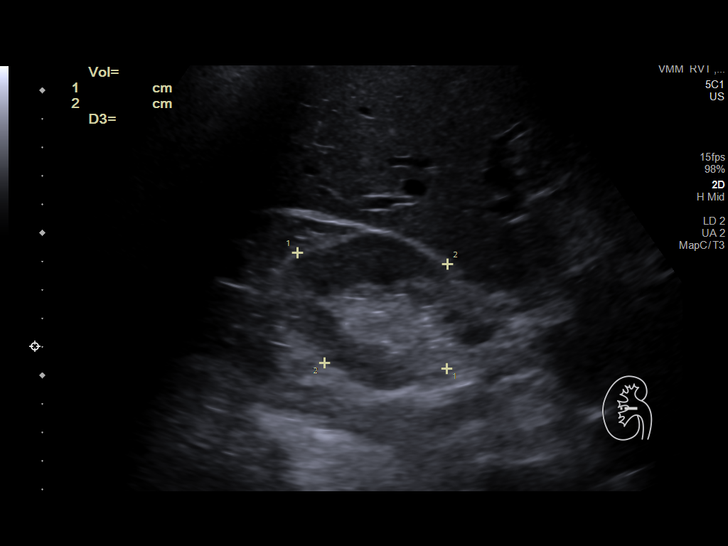
[im 15/39]
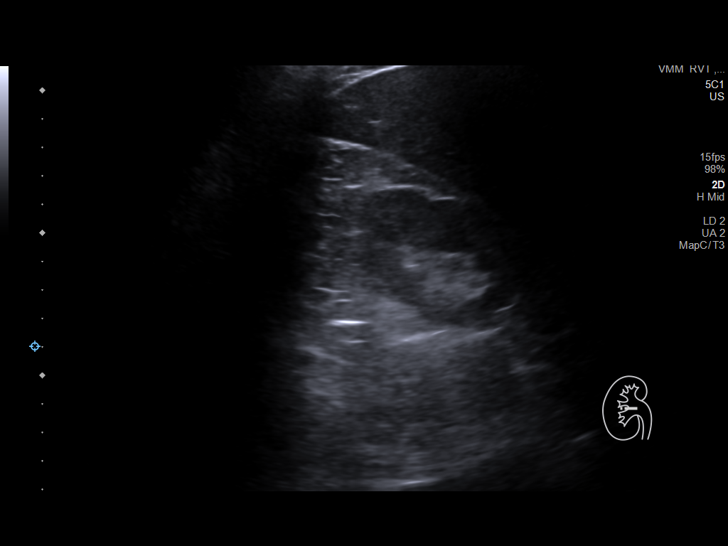
[im 18/39]
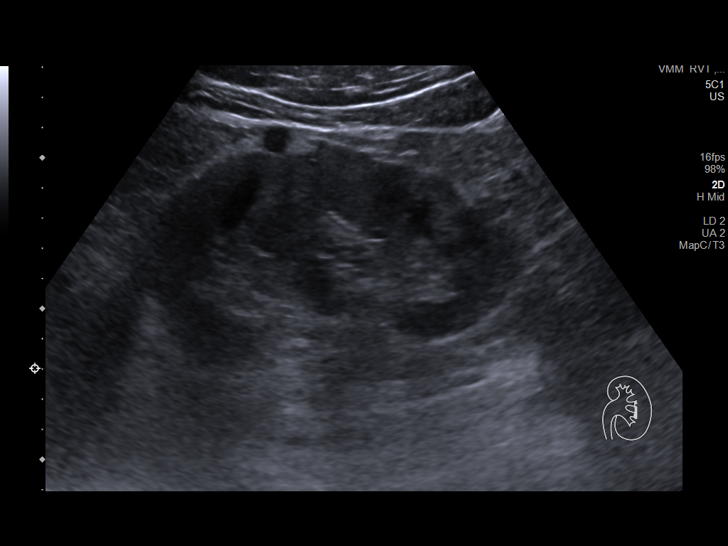
[im 21/39]
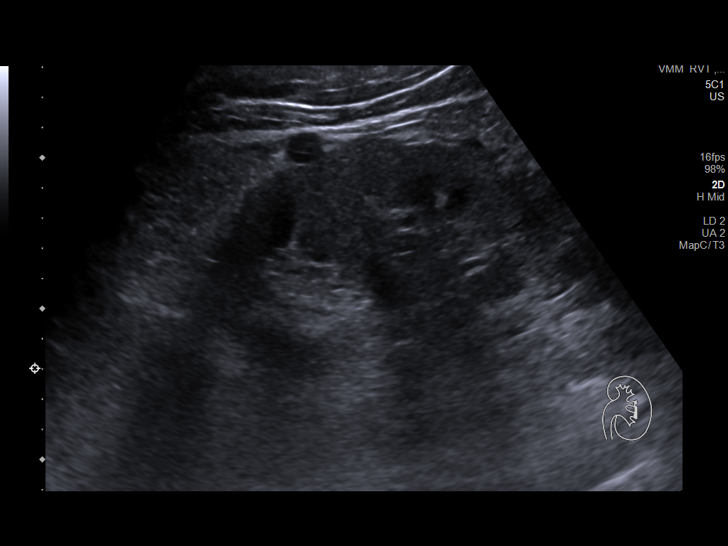
[im 24/39]
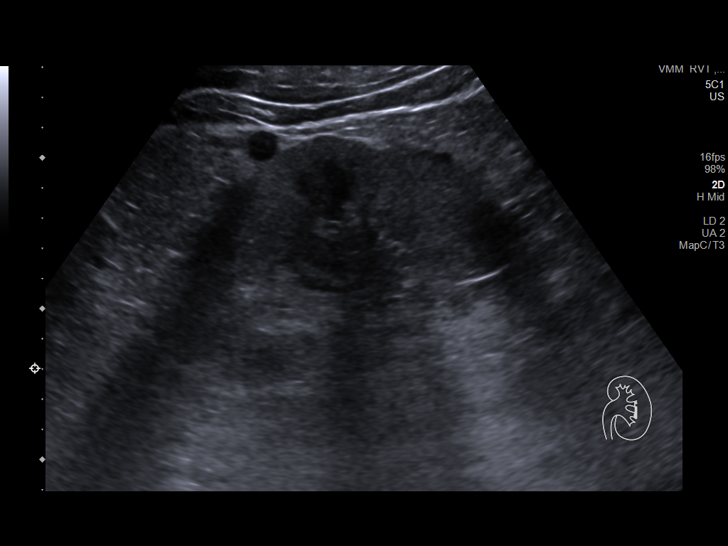
[im 26/39]
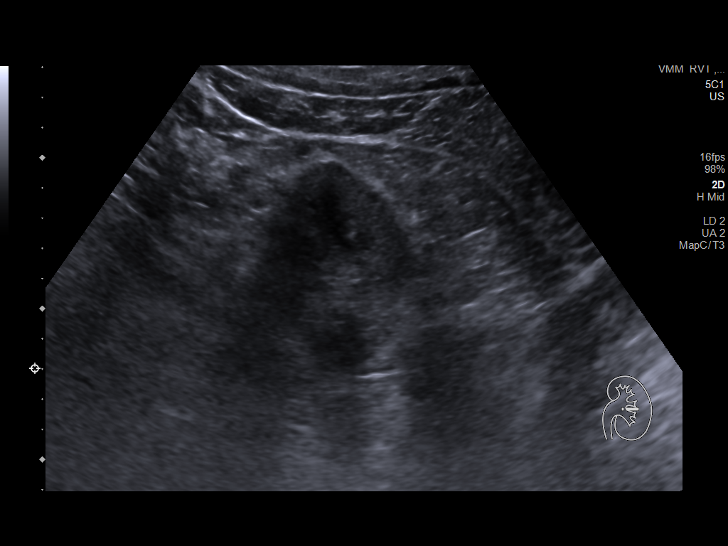
[im 29/39]
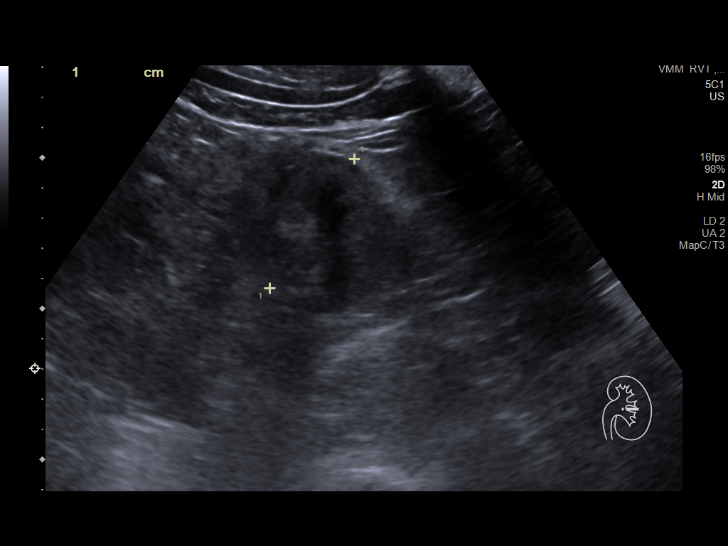
[im 32/39]
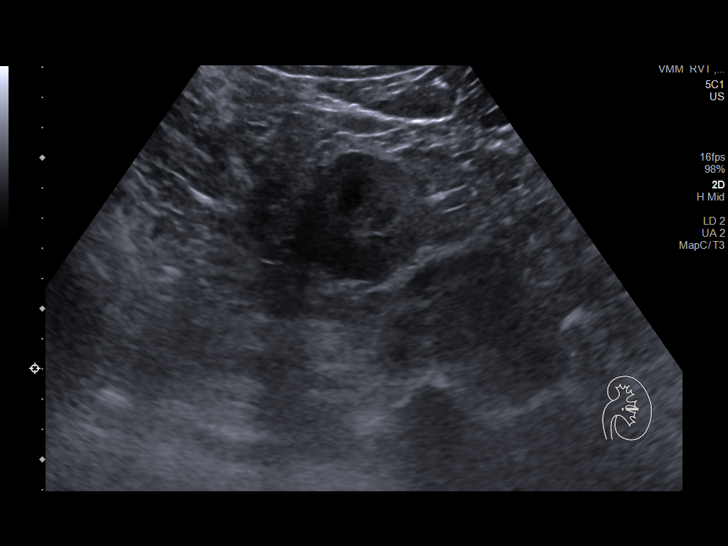
[im 35/39]
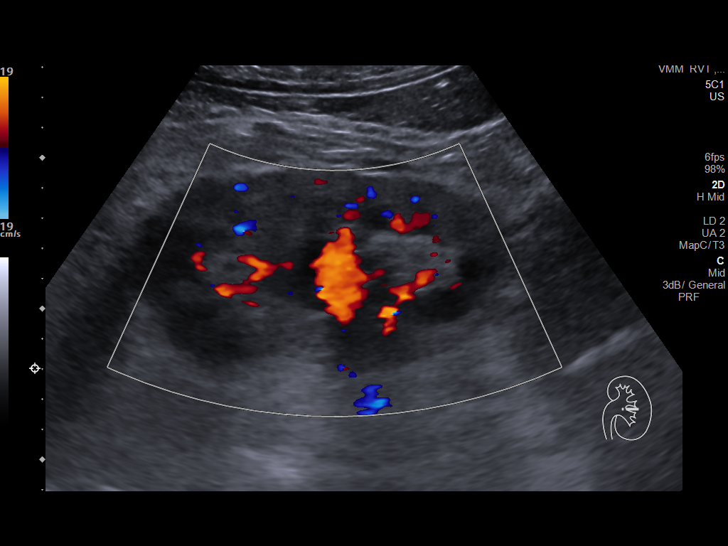
[im 39/39]
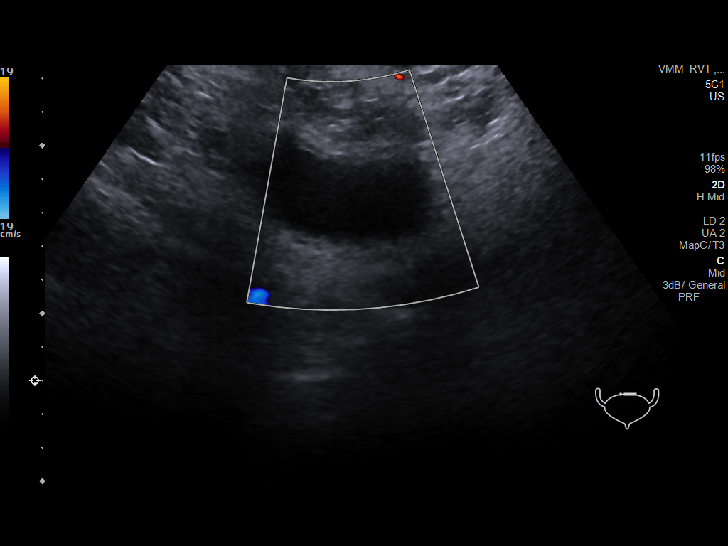

[14 of 25 positions shown; findings below may reference images not displayed]

FINDINGS: Right Kidney:

Renal measurements: 11.5 x 6.3 x 5.7 cm = volume: 221 mL .
Echogenicity within normal limits. No mass or hydronephrosis
visualized.

Left Kidney:

Renal measurements: 12.7 x 5.8 x 5.1 cm = volume: 196 mL. 1.2 cm
exophytic simple cyst is seen arising from midpole cortex.
Echogenicity within normal limits. No mass or hydronephrosis
visualized.

Bladder:

Appears normal for degree of bladder distention. Calculated prevoid
volume of 53 mL.

Other:

None.
IMPRESSION: 1.2 cm exophytic simple cyst arising from midpole cortex of left
kidney. No other renal abnormality seen.
